# Patient Record
Sex: Female | Born: 1955 | Race: White | Hispanic: No | Marital: Married | Smoking: Never smoker
Health system: Southern US, Community
[De-identification: ages and names within clinical notes are randomized; demographics above are authoritative.]

## PROBLEM LIST (undated history)

## (undated) DIAGNOSIS — G540 Brachial plexus disorders: Secondary | ICD-10-CM

## (undated) DIAGNOSIS — N189 Chronic kidney disease, unspecified: Secondary | ICD-10-CM

## (undated) DIAGNOSIS — K219 Gastro-esophageal reflux disease without esophagitis: Secondary | ICD-10-CM

## (undated) DIAGNOSIS — X58XXXA Exposure to other specified factors, initial encounter: Secondary | ICD-10-CM

## (undated) DIAGNOSIS — D219 Benign neoplasm of connective and other soft tissue, unspecified: Secondary | ICD-10-CM

## (undated) DIAGNOSIS — R112 Nausea with vomiting, unspecified: Secondary | ICD-10-CM

## (undated) DIAGNOSIS — J189 Pneumonia, unspecified organism: Secondary | ICD-10-CM

## (undated) DIAGNOSIS — M722 Plantar fascial fibromatosis: Secondary | ICD-10-CM

## (undated) DIAGNOSIS — K5790 Diverticulosis of intestine, part unspecified, without perforation or abscess without bleeding: Secondary | ICD-10-CM

## (undated) DIAGNOSIS — E042 Nontoxic multinodular goiter: Secondary | ICD-10-CM

## (undated) DIAGNOSIS — Z9889 Other specified postprocedural states: Secondary | ICD-10-CM

## (undated) HISTORY — DX: Benign neoplasm of connective and other soft tissue, unspecified: D21.9

## (undated) HISTORY — DX: Diverticulosis of intestine, part unspecified, without perforation or abscess without bleeding: K57.90

## (undated) HISTORY — PX: OTHER SURGICAL HISTORY: SHX169

## (undated) HISTORY — DX: Brachial plexus disorders: G54.0

## (undated) HISTORY — DX: Plantar fascial fibromatosis: M72.2

## (undated) HISTORY — DX: Nontoxic multinodular goiter: E04.2

## (undated) HISTORY — PX: TONSILLECTOMY: SUR1361

## (undated) HISTORY — DX: Exposure to other specified factors, initial encounter: X58.XXXA

---

## 1993-10-15 DIAGNOSIS — X58XXXA Exposure to other specified factors, initial encounter: Secondary | ICD-10-CM

## 1993-10-15 HISTORY — PX: CHOLECYSTECTOMY: SHX55

## 1993-10-15 HISTORY — PX: SPINAL FUSION: SHX223

## 1993-10-15 HISTORY — DX: Exposure to other specified factors, initial encounter: X58.XXXA

## 1995-10-16 HISTORY — PX: OTHER SURGICAL HISTORY: SHX169

## 1999-06-01 ENCOUNTER — Other Ambulatory Visit: Admission: RE | Admit: 1999-06-01 | Discharge: 1999-06-01 | Payer: Self-pay | Admitting: *Deleted

## 1999-10-16 HISTORY — PX: MYOMECTOMY: SHX85

## 2000-01-24 ENCOUNTER — Ambulatory Visit (HOSPITAL_COMMUNITY): Admission: RE | Admit: 2000-01-24 | Discharge: 2000-01-24 | Payer: Self-pay | Admitting: Obstetrics & Gynecology

## 2000-01-24 ENCOUNTER — Encounter (INDEPENDENT_AMBULATORY_CARE_PROVIDER_SITE_OTHER): Payer: Self-pay

## 2000-05-31 ENCOUNTER — Other Ambulatory Visit: Admission: RE | Admit: 2000-05-31 | Discharge: 2000-05-31 | Payer: Self-pay | Admitting: Obstetrics & Gynecology

## 2001-06-03 ENCOUNTER — Other Ambulatory Visit: Admission: RE | Admit: 2001-06-03 | Discharge: 2001-06-03 | Payer: Self-pay | Admitting: Obstetrics & Gynecology

## 2002-06-03 ENCOUNTER — Other Ambulatory Visit: Admission: RE | Admit: 2002-06-03 | Discharge: 2002-06-03 | Payer: Self-pay | Admitting: Obstetrics & Gynecology

## 2003-06-08 ENCOUNTER — Other Ambulatory Visit: Admission: RE | Admit: 2003-06-08 | Discharge: 2003-06-08 | Payer: Self-pay | Admitting: Obstetrics & Gynecology

## 2005-05-15 ENCOUNTER — Encounter: Admission: RE | Admit: 2005-05-15 | Discharge: 2005-05-15 | Payer: Self-pay | Admitting: Family Medicine

## 2005-05-30 ENCOUNTER — Encounter (INDEPENDENT_AMBULATORY_CARE_PROVIDER_SITE_OTHER): Payer: Self-pay | Admitting: Specialist

## 2005-05-30 ENCOUNTER — Other Ambulatory Visit: Admission: RE | Admit: 2005-05-30 | Discharge: 2005-05-30 | Payer: Self-pay | Admitting: Interventional Radiology

## 2005-05-30 ENCOUNTER — Encounter: Admission: RE | Admit: 2005-05-30 | Discharge: 2005-05-30 | Payer: Self-pay | Admitting: Family Medicine

## 2006-07-04 IMAGING — US US BIOPSY
1 series · 10 of 10 positions shown · non-contrast
Comparison: Thyroid Ultrasound, 05/15/05 ? [REDACTED].

CLINICAL DATA: Dominant nodule within right lobe of thyroid.  Requested to perform fine needle aspiration. 
 ULTRASOUND GUIDED FINE NEEDLE ASPIRATION RIGHT LOBE OF THYROID:

[Series 1: unknown · 0.06mm/px · 10 of 10 slices shown]
[im 1/10]
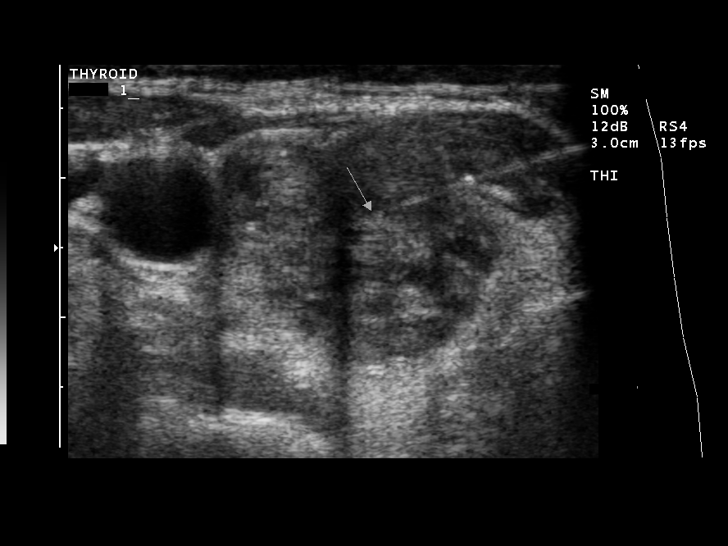
[im 2/10]
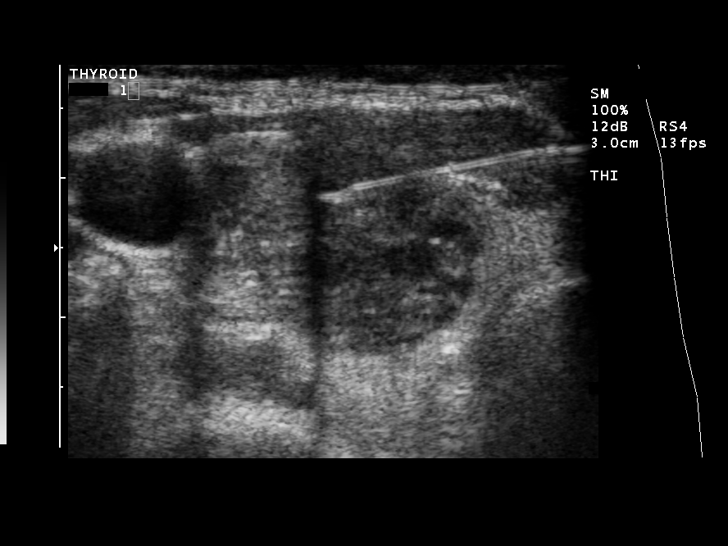
[im 3/10]
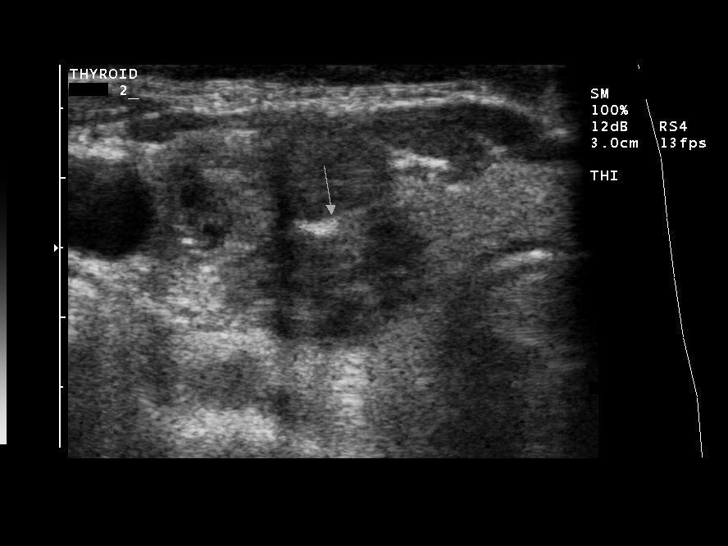
[im 4/10]
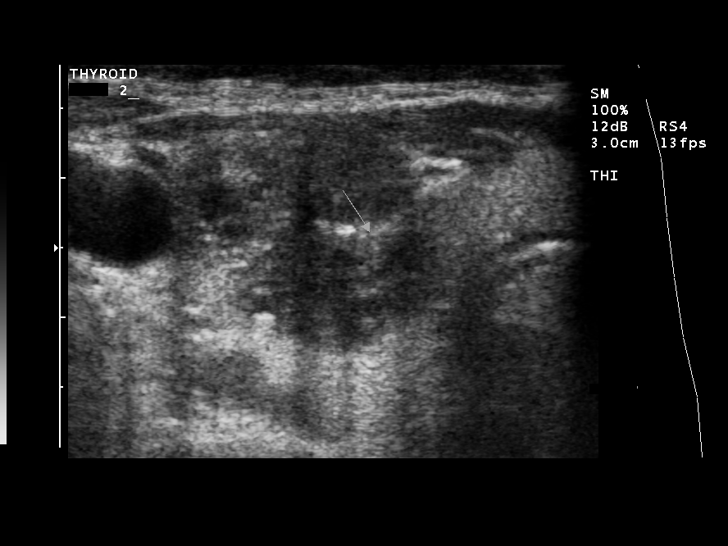
[im 5/10]
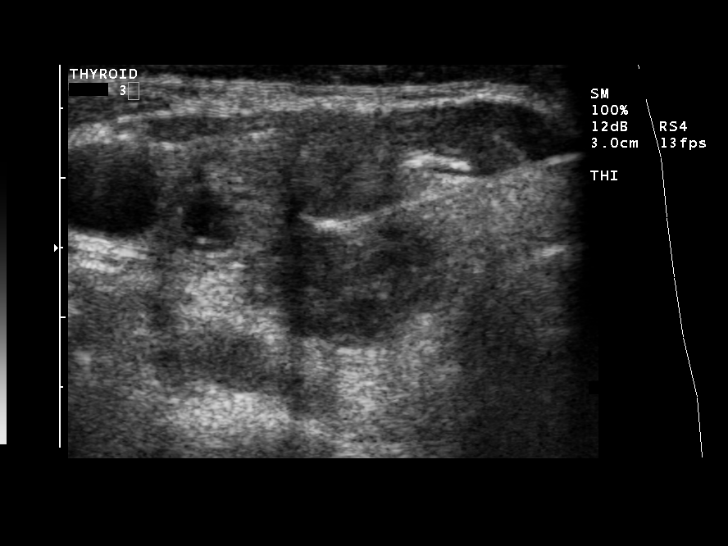
[im 6/10]
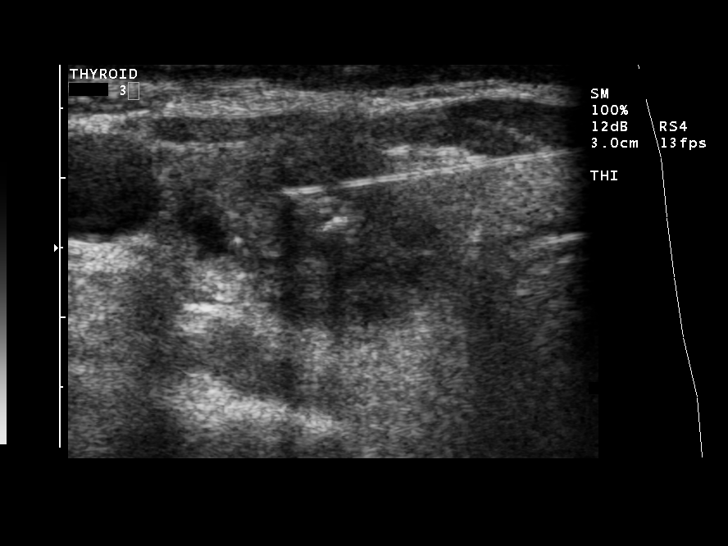
[im 7/10]
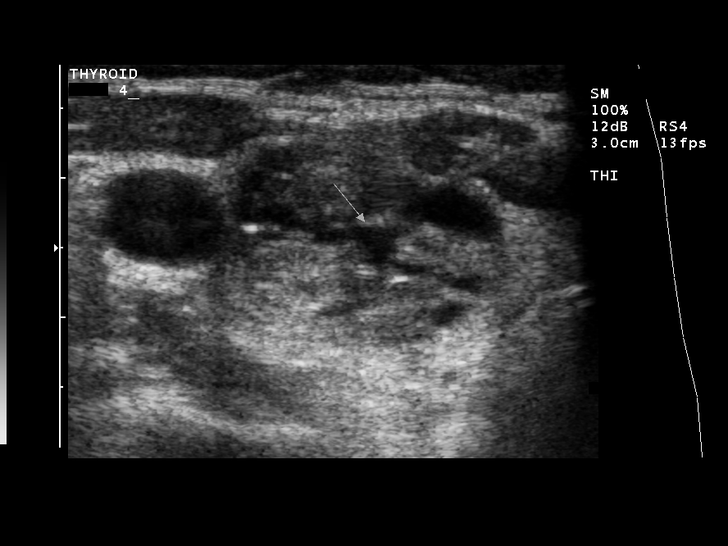
[im 8/10]
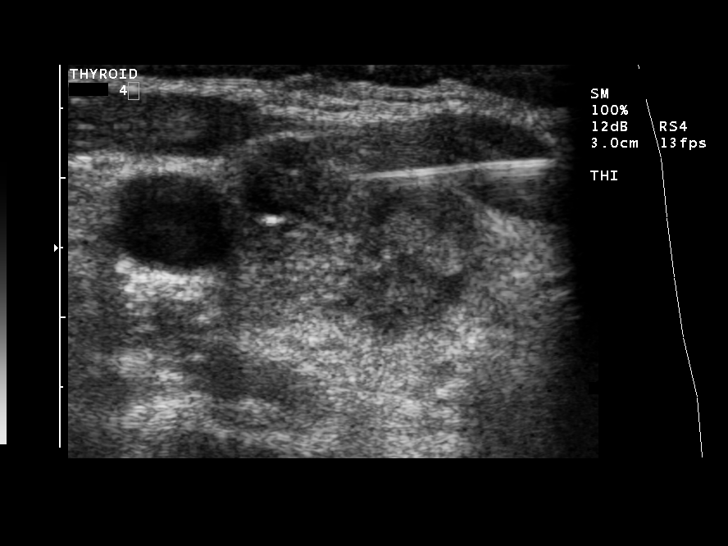
[im 9/10]
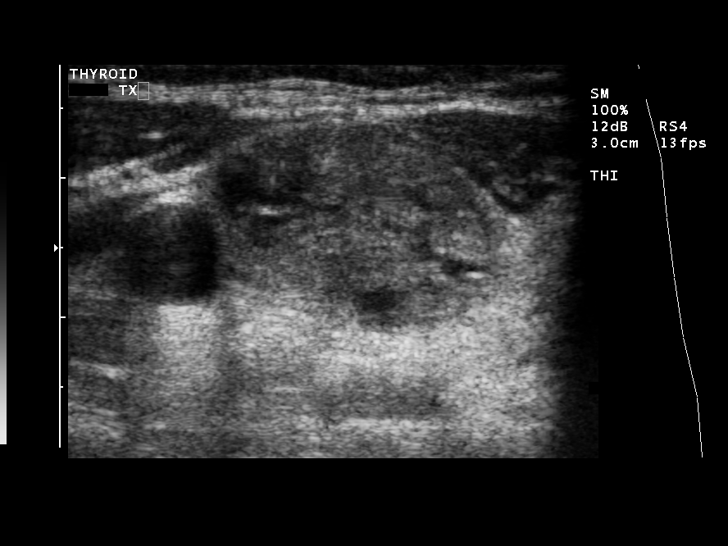
[im 10/10]
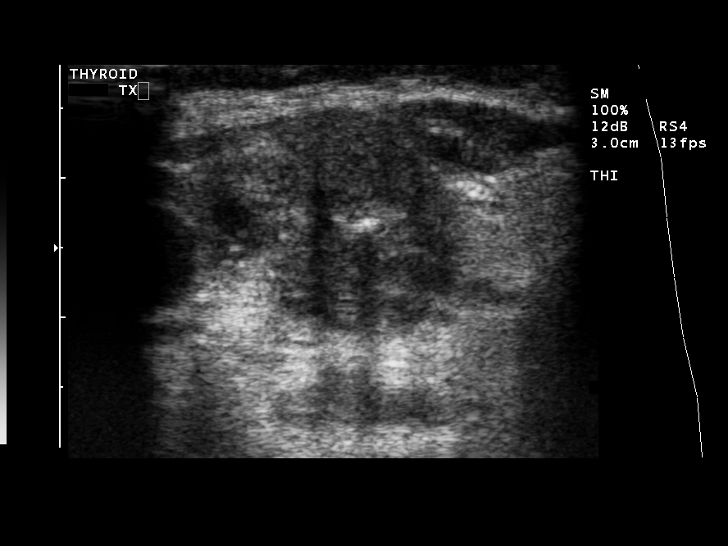

[10 of 10 positions shown; findings below may reference images not displayed]

FINDINGS: The above procedure was thoroughly discussed with the patient and written informed consent was obtained.
 Ultrasound was then performed to localize and mark an adequate site for the biopsy.  The patient was then prepped and draped in a normal sterile fashion, 1% Lidocaine was used for local anesthesia.  Using direct ultrasound guidance, four passes were made using a 25 gauge hypodermic needle into the dominant nodule located within the right lobe of the thyroid.  Ultrasound confirmed placement of the needle on all four occasions.  Approximately 2 cc of somewhat thin, yellowish material was also aspirated. Calcifications within this nodule were noted.  The specimens were given to pathology for further analysis.  Post procedure imaging demonstrated no hematoma or immediate complication. The patient tolerated the procedure well.
IMPRESSION: Successful ultrasound guided fine needle aspiration dominant right lobe of the thyroid.  Final pathology pending.

## 2006-10-15 HISTORY — PX: LAMINECTOMY: SHX219

## 2006-10-15 HISTORY — PX: OTHER SURGICAL HISTORY: SHX169

## 2006-10-17 ENCOUNTER — Ambulatory Visit (HOSPITAL_COMMUNITY): Admission: RE | Admit: 2006-10-17 | Discharge: 2006-10-18 | Payer: Self-pay | Admitting: Orthopedic Surgery

## 2007-08-13 ENCOUNTER — Ambulatory Visit (HOSPITAL_COMMUNITY): Admission: RE | Admit: 2007-08-13 | Discharge: 2007-08-13 | Payer: Self-pay | Admitting: Obstetrics & Gynecology

## 2008-01-21 ENCOUNTER — Encounter: Admission: RE | Admit: 2008-01-21 | Discharge: 2008-01-21 | Payer: Self-pay | Admitting: Family Medicine

## 2009-04-29 ENCOUNTER — Encounter: Admission: RE | Admit: 2009-04-29 | Discharge: 2009-04-29 | Payer: Self-pay | Admitting: Internal Medicine

## 2009-09-27 ENCOUNTER — Ambulatory Visit (HOSPITAL_COMMUNITY): Admission: RE | Admit: 2009-09-27 | Discharge: 2009-09-27 | Payer: Self-pay | Admitting: Obstetrics & Gynecology

## 2009-10-15 HISTORY — PX: ABDOMINAL HYSTERECTOMY: SHX81

## 2009-11-01 ENCOUNTER — Ambulatory Visit (HOSPITAL_COMMUNITY): Admission: RE | Admit: 2009-11-01 | Discharge: 2009-11-01 | Payer: Self-pay | Admitting: Obstetrics & Gynecology

## 2009-11-21 ENCOUNTER — Observation Stay (HOSPITAL_COMMUNITY): Admission: AD | Admit: 2009-11-21 | Discharge: 2009-11-22 | Payer: Self-pay | Admitting: Obstetrics & Gynecology

## 2010-05-23 ENCOUNTER — Encounter: Admission: RE | Admit: 2010-05-23 | Discharge: 2010-05-23 | Payer: Self-pay | Admitting: Internal Medicine

## 2010-10-15 HISTORY — PX: BLADDER SUSPENSION: SHX72

## 2010-12-07 ENCOUNTER — Ambulatory Visit (HOSPITAL_BASED_OUTPATIENT_CLINIC_OR_DEPARTMENT_OTHER)
Admission: RE | Admit: 2010-12-07 | Discharge: 2010-12-07 | Disposition: A | Discharge status: Home / Self Care | Payer: 59 | Attending: Urology | Admitting: Urology

## 2010-12-07 DIAGNOSIS — Z01810 Encounter for preprocedural cardiovascular examination: Secondary | ICD-10-CM | POA: Insufficient documentation

## 2010-12-07 DIAGNOSIS — N393 Stress incontinence (female) (male): Secondary | ICD-10-CM | POA: Insufficient documentation

## 2010-12-07 DIAGNOSIS — Z79899 Other long term (current) drug therapy: Secondary | ICD-10-CM | POA: Insufficient documentation

## 2010-12-07 DIAGNOSIS — Z01812 Encounter for preprocedural laboratory examination: Secondary | ICD-10-CM | POA: Insufficient documentation

## 2010-12-07 LAB — CBC
HCT: 42.4 % (ref 36.0–46.0)
Hemoglobin: 13.9 g/dL (ref 12.0–15.0)
MCH: 30.1 pg (ref 26.0–34.0)
MCHC: 32.8 g/dL (ref 30.0–36.0)
MCV: 91.8 fL (ref 78.0–100.0)

## 2010-12-07 LAB — PROTIME-INR
INR: 0.95 (ref 0.00–1.49)
Prothrombin Time: 12.9 seconds (ref 11.6–15.2)

## 2010-12-07 LAB — TYPE AND SCREEN
ABO/RH(D): O NEG
Antibody Screen: NEGATIVE

## 2010-12-28 NOTE — Op Note (Signed)
  NAMECARAL, Paula Hughes                ACCOUNT NO.:  [id number]  MEDICAL RECORD NO.:  [id number]          PATIENT TYPE:  AMB  LOCATION:  [facility]                         FACILITY:  [facility]  PHYSICIAN:  Martina Sinner, MD DATE OF BIRTH:  Jul 30, 1956  DATE OF PROCEDURE: DATE OF DISCHARGE:                              OPERATIVE REPORT   PREOPERATIVE DIAGNOSIS:  Stress incontinence.  POSTOPERATIVE DIAGNOSIS:  Stress incontinence.  SURGERIES:  Sling [facility]), cystourethropexy plus cystoscopy.  INDICATIONS FOR PROCEDURE:  Ms. Trueba has stress urinary incontinence. She takes Information systems manager.  PROCEDURE IN DETAIL:  She was prepped and draped in the usual fashion. Preoperative laboratory tests were normal.  She was given preoperative antibiotics.  Leg position was excellent at the beginning of the case to minimize the risk of compartment syndrome, neuropathy and DVT.  I made two 1-cm incisions, one fingerbreadth above the symphysis pubis, 1.5 cmlateral to the midline.  I made a 2-cm suburethral incision of appropriate depth after instilling 5 cc of lidocaine epinephrine mixture.  I sharply and bluntly dissected to the urethrovesical angle bilaterally.  With the bladder emptied, I passed a SPARC needle on top of and along the back of the symphysis pubis, staying on the periosteum and also staying lateral and then coming medial to the pulp of my index finger bilaterally.  I cystoscoped the patient.  I had an excellent look in the bladder. There was no injury to the bladder.  There was no movement of the bladder with movement of the trocar.  There was excellent blue jets bilaterally.  The urethra was normal.  With the bladder empty I attached the [facility] sling and brought it up through the retropubic space.  I cut below the blue dots, irrigated the sheath and removed the sheath.  It was tensioned over the fat part of a moderate-sized Kelly clamp.  There was appropriate hypermobility with no spring back  effect.  I was very happy with the position and tension of the sling.  All incisions were irrigated.  I closed the anterior vaginal wall with running 2-0 Vicryl on a CT1 needle.  I did two interrupted sutures.  I cut the sling below the skin and closed the skin in the abdomen with 4-0 Vicryl followed by Dermabond.  The leg position was good.  The catheter was draining blue urine. Hopefully the patient will reach her treatment goal.          ______________________________ Martina Sinner, MD     SAM/MEDQ  D:  12/07/2010  T:  12/07/2010  Job:  [id number]  Electronically Signed by Alfredo Martinez MD on 12/28/2010 12:49:29 PM

## 2010-12-31 LAB — ABO/RH: ABO/RH(D): O NEG

## 2010-12-31 LAB — CBC
MCV: 92.7 fL (ref 78.0–100.0)
RBC: 4.29 MIL/uL (ref 3.87–5.11)
WBC: 5.7 10*3/uL (ref 4.0–10.5)

## 2010-12-31 LAB — COMPREHENSIVE METABOLIC PANEL
ALT: 22 U/L (ref 0–35)
AST: 25 U/L (ref 0–37)
Albumin: 4.1 g/dL (ref 3.5–5.2)
Alkaline Phosphatase: 59 U/L (ref 39–117)
Potassium: 4.6 mEq/L (ref 3.5–5.1)
Sodium: 141 mEq/L (ref 135–145)
Total Protein: 6.8 g/dL (ref 6.0–8.3)

## 2010-12-31 LAB — TYPE AND SCREEN: Antibody Screen: NEGATIVE

## 2010-12-31 LAB — PREGNANCY, URINE: Preg Test, Ur: NEGATIVE

## 2011-01-03 LAB — CBC
Platelets: 218 10*3/uL (ref 150–400)
RBC: 3.82 MIL/uL — ABNORMAL LOW (ref 3.87–5.11)
WBC: 5.5 10*3/uL (ref 4.0–10.5)

## 2011-03-02 NOTE — Op Note (Signed)
NAMEFLORENE, BRILL                ACCOUNT NO.:  [id number]   MEDICAL RECORD NO.:  [id number]          PATIENT TYPE:  AMB   LOCATION:  DAY                          FACILITY:  [facility]   PHYSICIAN:  Ronald A. Gioffre, M.D.DATE OF BIRTH:  11/15/1955   DATE OF PROCEDURE:  10/17/2006  DATE OF DISCHARGE:                               OPERATIVE REPORT   SURGEONS:  Windy Fast A. Darrelyn Hillock, M.D.   ASSISTANT:  Marlowe Kays, M.D.   PREOPERATIVE DIAGNOSES:  1. Severe spinal stenosis at L4-5.  2. 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  3. Spinal recess stenosis at L5-S1 on the left.  4. Herniated disk at L5-S1 central to left.   POSTOPERATIVE DIAGNOSES:  1. Severe spinal stenosis at L4-5.  2. 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  3. Spinal recess stenosis at L5-S1 on the left.  4. Herniated disk at L5-S1 central to left.   OPERATION:  1. Central decompressive lumbar laminectomy at L4-5.  2. Facetectomy at L4-5 on the left.  3. Excision of a large synovial cyst and scar tissue at L4-5      compressing the L5 nerve root.  4. Foraminotomy.  5. Decompression of the lateral recess by carrying out hemilaminectomy      at L5-S1 on the left.  6. Microdiskectomy L5-S1 on the left.   PROCEDURE IN DETAIL:  Under general anesthesia,  a routine orthopedic  prepping and draping of the lower back was carried out.  The patient was  given 1 gram of IV Ancef.  With the patient on the spinal frame, two  needles were placed in the back for localization purposes.  An x-ray was  taken.  At this time an incision was made over the L4-5 and L5-S1  interspace.  Self-retaining retractors were inserted after I separated  the muscle from the lamina bilaterally.  We then went down and took  another x-ray and verified the exact position.  We removed the spinous  process of L4 and partial of L5.  I then went  down and did a complete  decompress central decompressive lumbar laminectomy at L4-5 and I did a  facetectomy on the left because a large synovial cyst and the facette  overgrowth.  We decompressed lateral recess nicely and decompressed the  L5 nerve root.  We did a nice foraminotomy as well.   There was no diskectomy necessary on that side.  I went up and did a  little decompression of the root above as well.  We went easily now and  passed a hockey-stick above and below both roots on the left side.  Following that we loosely applied some thrombin-soaked Gelfoam, went  down and did a hemilaminectomy L5-S1 on the left.  She had severe  stenosis of the lateral recess on that side.  We decompressed the  lateral recess as well, cauterized the lateral recess veins with a  bipolar.  We identified the S1 nerve root, did a nice foraminotomy and  gently retracted it medially and went down and identified the disk and  then made a cruciate incision at the posterior longitudinal ligament and  did a microdiskectomy at L5-S1 on the left.   We thoroughly irrigated out the area.  We now had good release of the  pressure from the roots.  As I mentioned 4-5 went bilaterally and  cleaned out both recesses on both sides even though she was symptomatic  only on the left.  Thoroughly irrigated out the area, loosely  approximated the wound in the usual fashion.  I left the distal and  proximal deep parts of the wound partially open for drainage purposes  and closed the remaining part of the wound in the usual fashion.  Metal  staples were used to close the skin.  Sterile Neosporin dressing was  applied.  The patient left the operating room in satisfactory condition.           ______________________________  Georges Lynch Darrelyn Hillock, M.D.     RAG/MEDQ  D:  10/17/2006  T:  10/17/2006  Job:  [id number]

## 2011-03-02 NOTE — Op Note (Signed)
[facility]  Patient:    Paula Hughes, Paula Hughes                         MRN: [id number] Adm. Date:  [id number] Attending:  Genia Del                           Operative Report  DATE OF BIRTH:                03-02-1956  PREOPERATIVE DIAGNOSIS:       Menometrorrhagia, probable endometrial polyps.  POSTOPERATIVE DIAGNOSIS:      Menometrorrhagia, submucosal myoma.  OPERATION:                    Hysteroscopy with dilatation and curettage and                               myomectomy. SURGEON:                      Genia Del, M.D.  ASSISTANT:  ANESTHESIA:  ANESTHESIOLOGIST:             Raul Del, M.D.  ESTIMATED BLOOD LOSS:  INDICATIONS:  DESCRIPTION OF PROCEDURE:     Under MAC with paracervical block, the patient being in lithotomy position, we proceed to Betadine prep on the suprapubic, vulva and  vaginal areas.   The bladder catheter is used to empty the bladder and the patient is prepped as usual.  The vaginal exam reveals an anteverted uterus, normal volume, mobile.  No adnexal mass palpated.  The speculum is introduced.  The paracervical block is done with 20 cc of lidocaine 1% at 4:00 and 8:00.  The anterior lip of the cervix is grasped with a tenaculum and dilatation is done until Hegar dilator #31. This is done easily.  A diagnostic hysteroscopy is then performed revealing a submucosal myoma on the right posterior aspect of the uterine cavity measuring about 2 cm in diameter.  No polyps are seen.  We then used the operative hysteroscope with the double loop to remove the myoma.  Myomectomy is done progressively with the double loop. Hemostasis is then controlled with cauterization. Hemostasis is adequate.  The cavity is otherwise normal.  We remove the instruments.  The estimated blood loss was 50 cc.  Deficits was 350 cc. No complications occurred and the patient was brought to the recovery room in  good  status.DD:  01/24/00 TD:  01/25/00 Job: [id number]

## 2011-06-14 ENCOUNTER — Ambulatory Visit: Payer: 59 | Admitting: Family Medicine

## 2011-07-03 ENCOUNTER — Ambulatory Visit (INDEPENDENT_AMBULATORY_CARE_PROVIDER_SITE_OTHER): Payer: 59 | Admitting: Sports Medicine

## 2011-07-03 DIAGNOSIS — M25569 Pain in unspecified knee: Secondary | ICD-10-CM

## 2011-07-03 DIAGNOSIS — M2170 Unequal limb length (acquired), unspecified site: Secondary | ICD-10-CM

## 2011-07-03 DIAGNOSIS — M25561 Pain in right knee: Secondary | ICD-10-CM

## 2011-07-03 DIAGNOSIS — M7740 Metatarsalgia, unspecified foot: Secondary | ICD-10-CM | POA: Insufficient documentation

## 2011-07-03 DIAGNOSIS — M25562 Pain in left knee: Secondary | ICD-10-CM | POA: Insufficient documentation

## 2011-07-03 DIAGNOSIS — M7750 Other enthesopathy of unspecified foot: Secondary | ICD-10-CM

## 2011-07-03 NOTE — Assessment & Plan Note (Addendum)
Given a strap - patellar helix to try  Has good strength except hip felxion Given rehab exercises

## 2011-07-03 NOTE — Progress Notes (Signed)
  Subjective:    Patient ID: Paula Hughes, female    DOB: January 30, 1956, 55 y.o.   MRN: [id number]  HPI Pt started Zumba x 1 year ago and her knees ache afterwards. This "aching" is 4-5/10 and can last from half day to whole day. Icing and Advil are helpful. Pain is bad enough to cause her to limp.  Also, pt had a fall onto her R flexed knee and her L hip in Aug 2012. She had lots of pain initially but it is improving. It continues to be painful with direct weight on knee when she is kneeling or going up a lot of stairs. She also describes transient neuropathic pain that extends from lat patella to lat condyle. No pain with walking.   Review of Systems     Objective:   Physical Exam NAD Knees: bilateral crepitus with movement, nl ROM (0 deg extension and 160 deg flexion), 5/5 knee flexion and ext bilaterally R knee: point tenderness localized to a spot on patella, point tenderness along joint line below lat femoral condyle; Neg McMurray's, good ACL/MCL/LCL stability on R knee L knee: slight valgus deformity R hip flexion: weaker at 4/5 strength  Bilat pronation of long arch with midfoot shift Transverse arch collapse  Leg length discrepancy- rt leg is just slightly shorter than left Breakdown of rt longitudinal arch, breakdown of transverse arch bilaterally     Assessment & Plan:  Pt's bilateral knee pain after Zumba is most likely secondary to leg length discrepancy, inc stress on knees from activity and shoes without much support.  Pt's lat neuropathic pain in Rt knee likely from Anterior Geniculate Nerve irritation. Will try knee strap.  Weak hip flexion: pt given strengthening exercises to do- hip rotation and step up  Corrections: Zumba shoes- bilateral small scaphoid pads and heel lift correction on right Walking shoes- green insoles with metatarsal pads, pt to wear home heel cup on the right  Return in 1 month

## 2011-07-03 NOTE — Assessment & Plan Note (Signed)
MT pads added to sports insoles for exercise  Scaphoid pads added to Zumba shoes

## 2011-07-03 NOTE — Assessment & Plan Note (Signed)
Correction added on left  This improves her gait to lessen the genu valgus

## 2011-07-11 ENCOUNTER — Other Ambulatory Visit (HOSPITAL_COMMUNITY): Payer: Self-pay | Admitting: Obstetrics & Gynecology

## 2011-07-11 DIAGNOSIS — Z1231 Encounter for screening mammogram for malignant neoplasm of breast: Secondary | ICD-10-CM

## 2011-07-19 ENCOUNTER — Ambulatory Visit (HOSPITAL_COMMUNITY)
Admission: RE | Admit: 2011-07-19 | Discharge: 2011-07-19 | Disposition: A | Discharge status: Home / Self Care | Payer: 59 | Source: Ambulatory Visit | Attending: Obstetrics & Gynecology | Admitting: Obstetrics & Gynecology

## 2011-07-19 DIAGNOSIS — Z1231 Encounter for screening mammogram for malignant neoplasm of breast: Secondary | ICD-10-CM | POA: Insufficient documentation

## 2011-07-24 ENCOUNTER — Other Ambulatory Visit: Payer: Self-pay | Admitting: Obstetrics & Gynecology

## 2011-07-24 DIAGNOSIS — R928 Other abnormal and inconclusive findings on diagnostic imaging of breast: Secondary | ICD-10-CM

## 2011-07-26 ENCOUNTER — Ambulatory Visit (INDEPENDENT_AMBULATORY_CARE_PROVIDER_SITE_OTHER): Payer: 59 | Admitting: Family Medicine

## 2011-07-26 ENCOUNTER — Encounter: Payer: Self-pay | Admitting: *Deleted

## 2011-07-26 ENCOUNTER — Encounter: Payer: Self-pay | Admitting: Family Medicine

## 2011-07-26 DIAGNOSIS — Z79899 Other long term (current) drug therapy: Secondary | ICD-10-CM

## 2011-07-26 DIAGNOSIS — E559 Vitamin D deficiency, unspecified: Secondary | ICD-10-CM

## 2011-07-26 DIAGNOSIS — E042 Nontoxic multinodular goiter: Secondary | ICD-10-CM | POA: Insufficient documentation

## 2011-07-26 DIAGNOSIS — Z0000 Encounter for general adult medical examination without abnormal findings: Secondary | ICD-10-CM

## 2011-07-26 DIAGNOSIS — Z23 Encounter for immunization: Secondary | ICD-10-CM

## 2011-07-26 LAB — POCT URINALYSIS DIPSTICK
Blood, UA: NEGATIVE
Glucose, UA: NEGATIVE
Ketones, UA: NEGATIVE
Protein, UA: NEGATIVE
Spec Grav, UA: 1.01

## 2011-07-26 NOTE — Patient Instructions (Addendum)
HEALTH MAINTENANCE RECOMMENDATIONS:  It is recommended that you get at least 30 minutes of aerobic exercise at least 5 days/week (for weight loss, you may need as much as 60-90 minutes). This can be any activity that gets your heart rate up. This can be divided in 10-15 minute intervals if needed, but try and build up your endurance at least once a week.  Weight bearing exercise is also recommended twice weekly.  Eat a healthy diet with lots of vegetables, fruits and fiber.  "Colorful" foods have a lot of vitamins (ie green vegetables, tomatoes, red peppers, etc).  Limit sweet tea, regular sodas and alcoholic beverages, all of which has a lot of calories and sugar.  Up to 1 alcoholic drink daily may be beneficial for women (unless trying to lose weight, watch sugars).  Drink a lot of water.  Calcium recommendations are 1200-1500 mg daily (1500 mg for postmenopausal women or women without ovaries), and vitamin D 1000 IU daily.  This should be obtained from diet and/or supplements (vitamins), and calcium should not be taken all at once, but in divided doses.  Monthly self breast exams and yearly mammograms for women over the age of 47 is recommended.  Sunscreen of at least SPF 30 should be used on all sun-exposed parts of the skin when outside between the hours of 10 am and 4 pm (not just when at beach or pool, but even with exercise, golf, tennis, and yard work!)  Use a sunscreen that says "broad spectrum" so it covers both UVA and UVB rays, and make sure to reapply every 1-2 hours.  Remember to change the batteries in your smoke detectors when changing your clock times in the spring and fall.  Use your seat belt every time you are in a car, and please drive safely and not be distracted with cell phones and texting while driving.   Check insurance regarding zostavax--may need to wait until age 56  Consider tapering down to 1/2 tablet of your estradiol in the cooler weather if doing very well  without hot flashes
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
## 2011-07-31 ENCOUNTER — Other Ambulatory Visit: Payer: Self-pay | Admitting: Family Medicine

## 2011-07-31 ENCOUNTER — Ambulatory Visit (INDEPENDENT_AMBULATORY_CARE_PROVIDER_SITE_OTHER): Payer: 59 | Admitting: Sports Medicine

## 2011-07-31 ENCOUNTER — Other Ambulatory Visit: Payer: 59

## 2011-07-31 ENCOUNTER — Telehealth: Payer: Self-pay | Admitting: Family Medicine

## 2011-07-31 VITALS — BP 108/68

## 2011-07-31 DIAGNOSIS — M25561 Pain in right knee: Secondary | ICD-10-CM

## 2011-07-31 DIAGNOSIS — E042 Nontoxic multinodular goiter: Secondary | ICD-10-CM

## 2011-07-31 DIAGNOSIS — Z79899 Other long term (current) drug therapy: Secondary | ICD-10-CM

## 2011-07-31 DIAGNOSIS — M7750 Other enthesopathy of unspecified foot: Secondary | ICD-10-CM

## 2011-07-31 DIAGNOSIS — M722 Plantar fascial fibromatosis: Secondary | ICD-10-CM | POA: Insufficient documentation

## 2011-07-31 DIAGNOSIS — E559 Vitamin D deficiency, unspecified: Secondary | ICD-10-CM

## 2011-07-31 DIAGNOSIS — M25569 Pain in unspecified knee: Secondary | ICD-10-CM

## 2011-07-31 DIAGNOSIS — M2170 Unequal limb length (acquired), unspecified site: Secondary | ICD-10-CM

## 2011-07-31 DIAGNOSIS — M7740 Metatarsalgia, unspecified foot: Secondary | ICD-10-CM

## 2011-07-31 LAB — COMPREHENSIVE METABOLIC PANEL
ALT: 23 U/L (ref 0–35)
Albumin: 4.2 g/dL (ref 3.5–5.2)
BUN: 20 mg/dL (ref 6–23)
CO2: 24 mEq/L (ref 19–32)
Calcium: 9.9 mg/dL (ref 8.4–10.5)
Chloride: 107 mEq/L (ref 96–112)
Creat: 0.77 mg/dL (ref 0.50–1.10)
Potassium: 4.5 mEq/L (ref 3.5–5.3)

## 2011-07-31 LAB — LIPID PANEL
LDL Cholesterol: 83 mg/dL (ref 0–99)
Triglycerides: 54 mg/dL (ref <150)

## 2011-07-31 NOTE — Progress Notes (Signed)
  Subjective:    Patient ID: Paula Hughes, female    DOB: 06/15/1956, 55 y.o.   MRN: [id number]  HPI  Pt presents to clinic for f/u of bilat knee pain which is improving, and metatarsalgia. Knees still sore after zumba.  Has tried full knee sleeves and bodyhelix patellar straps for zumba.  Patellar straps seem to be more helpful.   Using sports insoles in walking shoes with MT pads which are comfortable, except she feels lt MT pad is too far forward.  In zumba shoes has scaphoid pad and heel lift on rt, which is also comfortable.  Has heel pain on rt which she has been using a gel heel cushion for which helps significantly.   She feels that her plantar fascia symptoms have increased as she has been more active with exercise Morning pain is present on the right    Review of Systems     Objective:   Physical Exam NAD  Hip abduction mildly weak bilaterally Hip rotation strong bilat She has crepitation on flexion and extension of the right knee Moderate crepitation is noted on flexion extension of the left knee There is tenderness on palpation of the right heel at the medial insertion of the plantar fascia She has good range of motion of the great toe She has mild loss of the longitudinal and transverse arch She does not walk with a limp        Assessment & Plan:

## 2011-07-31 NOTE — Assessment & Plan Note (Signed)
Continue metatarsal pads  We repositioned the left one slightly more forward

## 2011-07-31 NOTE — Assessment & Plan Note (Signed)
A correction with felt lift is made into her dance shoes and on to her sports insoles

## 2011-07-31 NOTE — Telephone Encounter (Signed)
Gave to jo 

## 2011-07-31 NOTE — Telephone Encounter (Signed)
We had discussed at visit that her lipids was excellent in 2010, and only needed to be checked every 5 years, which is why we didn't order lipid panel.  If she has changed her mind, and would like lipids checked (which it sounds like she does), then please add FLP with a dx code of v77.91.  Please let [name] know.  Thanks

## 2011-07-31 NOTE — Patient Instructions (Signed)
Please try new corrections in your shoes  Start stretches for plantar fasciitis, and continue heels lifts with knees straight and knees bent  Soak right foot in ice water at the end of the day   Do not get too much bend in your knee (not past 45 degrees)  when using weight equipment at the gym  Please follow up in 8 weeks if you are not improving

## 2011-07-31 NOTE — Assessment & Plan Note (Signed)
She will continue to use a gel heel cup  Stretches ice and exercises  We added a scaphoid pad to her sports insoles  Recheck in 8 weeks and if not improved consider ultrasound scan

## 2011-07-31 NOTE — Assessment & Plan Note (Signed)
She is given another patella helix strap as these seem to lessen her knee problems with exercise class  She is to limit the degree of knee bend for her exercise

## 2011-08-01 ENCOUNTER — Encounter: Payer: Self-pay | Admitting: Family Medicine

## 2011-08-01 LAB — VITAMIN D 25 HYDROXY (VIT D DEFICIENCY, FRACTURES): Vit D, 25-Hydroxy: 46 ng/mL (ref 30–89)

## 2011-08-21 ENCOUNTER — Ambulatory Visit
Admission: RE | Admit: 2011-08-21 | Discharge: 2011-08-21 | Disposition: A | Discharge status: Home / Self Care | Payer: 59 | Source: Ambulatory Visit | Attending: Obstetrics & Gynecology | Admitting: Obstetrics & Gynecology

## 2011-08-21 DIAGNOSIS — R928 Other abnormal and inconclusive findings on diagnostic imaging of breast: Secondary | ICD-10-CM

## 2011-09-19 ENCOUNTER — Ambulatory Visit (INDEPENDENT_AMBULATORY_CARE_PROVIDER_SITE_OTHER): Payer: 59 | Admitting: Sports Medicine

## 2011-09-19 VITALS — BP 130/80

## 2011-09-19 DIAGNOSIS — M7740 Metatarsalgia, unspecified foot: Secondary | ICD-10-CM

## 2011-09-19 DIAGNOSIS — M25561 Pain in right knee: Secondary | ICD-10-CM

## 2011-09-19 DIAGNOSIS — M7750 Other enthesopathy of unspecified foot: Secondary | ICD-10-CM

## 2011-09-19 DIAGNOSIS — M722 Plantar fascial fibromatosis: Secondary | ICD-10-CM

## 2011-09-19 DIAGNOSIS — M25569 Pain in unspecified knee: Secondary | ICD-10-CM

## 2011-09-19 NOTE — Assessment & Plan Note (Addendum)
Medium MT pads added to shoes for work and zumba May later add to all shoes Consider making custom orthotics if better control needed Currently getting good results with sports insoles

## 2011-09-19 NOTE — Progress Notes (Signed)
  Subjective:    Patient ID: Paula Hughes, female    DOB: 08/10/1956, 55 y.o.   MRN: [id number]  HPI  Knee pain has improved, uses patellar straps which are very helpful.  Has plantar fasciitis on the right foot. Pain at insertion of PF at heel.   Using heel cup, ice baths at night, home exercises. Does stretches in the morning before getting up. Needs note to be able to wear athletic shoes at work.   Has more severe forefoot pain bilat but more on left Hard calluses over forefoot  Fam HX : mother had RA/  Father had peripheral neuropathy and non hodgkins lymphoma       Review of Systems     Objective:   Physical Exam   Rt foot exam: Good great toe motion Tenderness with palpation at insertion of PF on rt heel Sitting has cavus foot Standing arch drops 1-2 cm splaying of 1-2 toes Hammering of PIP 2-3 toes  Morton's callus on rt dropping of MT heads 2-4  Lt foot has thick morton's callus Small cystic change anterior to 2nd MTP Slight hammering of PIP 2-3       Assessment & Plan:

## 2011-09-19 NOTE — Patient Instructions (Signed)
Please continue doing icing, and plantar fasciitis exercises and stretches  Try arch strap on right foot  Use shoes with supportive insoles  We would like to recheck this after 6 wks to 8 wks of trying this  Bring additional shoes for MT pads

## 2011-09-19 NOTE — Assessment & Plan Note (Signed)
We will try an arch strap Continue exercises, stretching, and icing We will ask her to wear supportive shoes in the work environment until this improves

## 2011-09-19 NOTE — Assessment & Plan Note (Signed)
This is clearly improved and able to cont exercise

## 2011-11-07 ENCOUNTER — Ambulatory Visit (INDEPENDENT_AMBULATORY_CARE_PROVIDER_SITE_OTHER): Payer: 59 | Admitting: Sports Medicine

## 2011-11-07 DIAGNOSIS — M722 Plantar fascial fibromatosis: Secondary | ICD-10-CM

## 2011-11-07 DIAGNOSIS — M7750 Other enthesopathy of unspecified foot: Secondary | ICD-10-CM

## 2011-11-07 DIAGNOSIS — M7740 Metatarsalgia, unspecified foot: Secondary | ICD-10-CM

## 2011-11-07 NOTE — Progress Notes (Signed)
  Subjective:    Patient ID: Paula Hughes, female    DOB: December 19, 1955, 56 y.o.   MRN: [id number]  HPI The patient returns for followup of her metatarsalgia as well as plantar fasciitis on the right side. She's wearing several heel pad inserts in her right shoe, and metatarsal pads in both shoes. She's about 20% better with regards to her plantar fasciitis, and is really looking just to get some more metatarsal pads.   Review of Systems    No fevers, chills, night sweats, weight loss, chest pain, or shortness of breath.  Objective:   Physical Exam  Cavus foot, Morton's callus. Still tender to palpation at right plantar fascia.       Assessment & Plan:   Metatarsalgia: 6 metatarsal pads given. Patient will follow up as needed.  Plantar fasciitis: She will continue to do the rehabilitation exercises. Followup as needed.

## 2012-02-21 ENCOUNTER — Telehealth: Payer: Self-pay | Admitting: Family Medicine

## 2012-02-21 MED ORDER — NITROFURANTOIN MONOHYD MACRO 100 MG PO CAPS: 100.0000 mg | ORAL_CAPSULE | Freq: Two times a day (BID) | ORAL | Status: DC

## 2012-02-21 NOTE — Telephone Encounter (Signed)
Spoke with patient and she states that her symptoms include burning after urination and frequency. She reports no fever, no vomiting, no flank pain and no back pain. Called in Macrobid 100mg  #14 BID x 7 days to [facility]. She will follow up if still having symptoms after completion of abx.

## 2012-02-21 NOTE — Telephone Encounter (Signed)
Please document her symptoms, and ensure no fevers or flank pain, or vomiting.  If simple UTI symptoms without these other symptoms, okay for macrobid 100mg  po BID x 7 days.  Check that pt has done well with this in past (no recent UTI's).

## 2012-02-21 NOTE — Telephone Encounter (Signed)
Pt called and stated she has a uti. Pt also states that she has a long history of these that is well documented. Pt is requesting that you call her in something. She states due to her work schedule it is impossible for her to come in and because of history she felt that you might call in an rx. Pt uses [facility].

## 2012-04-29 ENCOUNTER — Telehealth: Payer: Self-pay | Admitting: Internal Medicine

## 2012-04-29 NOTE — Telephone Encounter (Signed)
Doesn't look like I rx'd this per computer. She sees a GYN, so should be sent to GYN for renewal

## 2012-04-30 NOTE — Telephone Encounter (Signed)
Faxed back to pharmacy asking them to fax to pt's gyn.

## 2012-08-07 ENCOUNTER — Ambulatory Visit (INDEPENDENT_AMBULATORY_CARE_PROVIDER_SITE_OTHER): Payer: 59 | Admitting: Family Medicine

## 2012-08-07 ENCOUNTER — Encounter: Payer: Self-pay | Admitting: Family Medicine

## 2012-08-07 VITALS — BP 122/74 | HR 60 | Ht 70.0 in | Wt 185.0 lb

## 2012-08-07 DIAGNOSIS — Z0000 Encounter for general adult medical examination without abnormal findings: Secondary | ICD-10-CM

## 2012-08-07 DIAGNOSIS — E042 Nontoxic multinodular goiter: Secondary | ICD-10-CM

## 2012-08-07 DIAGNOSIS — R079 Chest pain, unspecified: Secondary | ICD-10-CM

## 2012-08-07 LAB — CBC WITH DIFFERENTIAL/PLATELET
Eosinophils Absolute: 0.1 10*3/uL (ref 0.0–0.7)
Eosinophils Relative: 3 % (ref 0–5)
HCT: 42.7 % (ref 36.0–46.0)
Lymphs Abs: 1.1 10*3/uL (ref 0.7–4.0)
MCH: 30.7 pg (ref 26.0–34.0)
MCV: 93.6 fL (ref 78.0–100.0)
Monocytes Absolute: 0.3 10*3/uL (ref 0.1–1.0)
Platelets: 217 10*3/uL (ref 150–400)
RBC: 4.56 MIL/uL (ref 3.87–5.11)
RDW: 12.7 % (ref 11.5–15.5)

## 2012-08-07 LAB — COMPREHENSIVE METABOLIC PANEL
ALT: 15 U/L (ref 0–35)
Alkaline Phosphatase: 55 U/L (ref 39–117)
Sodium: 140 mEq/L (ref 135–145)
Total Bilirubin: 0.8 mg/dL (ref 0.3–1.2)
Total Protein: 6.5 g/dL (ref 6.0–8.3)

## 2012-08-07 LAB — POCT URINALYSIS DIPSTICK
Leukocytes, UA: NEGATIVE
Protein, UA: NEGATIVE
Urobilinogen, UA: NEGATIVE

## 2012-08-07 NOTE — Patient Instructions (Addendum)
HEALTH MAINTENANCE RECOMMENDATIONS:  It is recommended that you get at least 30 minutes of aerobic exercise at least 5 days/week (for weight loss, you may need as much as 60-90 minutes). This can be any activity that gets your heart rate up. This can be divided in 10-15 minute intervals if needed, but try and build up your endurance at least once a week.  Weight bearing exercise is also recommended twice weekly.  Eat a healthy diet with lots of vegetables, fruits and fiber.  "Colorful" foods have a lot of vitamins (ie green vegetables, tomatoes, red peppers, etc).  Limit sweet tea, regular sodas and alcoholic beverages, all of which has a lot of calories and sugar.  Up to 1 alcoholic drink daily may be beneficial for women (unless trying to lose weight, watch sugars).  Drink a lot of water.  Calcium recommendations are 1200-1500 mg daily (1500 mg for postmenopausal women or women without ovaries), and vitamin D 1000 IU daily.  This should be obtained from diet and/or supplements (vitamins), and calcium should not be taken all at once, but in divided doses.  Monthly self breast exams and yearly mammograms for women over the age of 20 is recommended.  Sunscreen of at least SPF 30 should be used on all sun-exposed parts of the skin when outside between the hours of 10 am and 4 pm (not just when at beach or pool, but even with exercise, golf, tennis, and yard work!)  Use a sunscreen that says "broad spectrum" so it covers both UVA and UVB rays, and make sure to reapply every 1-2 hours.  Remember to change the batteries in your smoke detectors when changing your clock times in the spring and fall.  Use your seat belt every time you are in a car, and please drive safely and not be distracted with cell phones and texting while driving.  See GYN yearly for breast and pelvic exam (or return here for them).  Chest and throat symptoms--not truly typical of angina or reflux.  Possibly esophageal spasm.  Given  infrequent episodes, and short-lived nature, will just keep track for now.  If is exertional, nor not self-resolving, go to ER.  Can try acid-reducing medication at time of symptoms to see if it helps (mylanta, maalox, pepcid complete).  Keep journal with symptoms, severity, what you were doing, what you had eaten, etc. And return to discuss if having ongoing problems.  May need further evaluation (?cardiac vs GI).  Check with your insurance regarding shingles vaccine (zostavax) coverage.  They may not cover until age 88, but if they do, you can return for nurse visit.
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
## 2012-08-08 ENCOUNTER — Encounter: Payer: Self-pay | Admitting: Family Medicine

## 2012-08-08 NOTE — Addendum Note (Signed)
Addended by: Debbrah Alar F on: 08/08/2012 11:02 AM   Modules accepted: Orders

## 2012-10-20 ENCOUNTER — Encounter: Payer: 59 | Admitting: Medical

## 2012-11-19 ENCOUNTER — Telehealth: Payer: Self-pay | Admitting: *Deleted

## 2012-11-19 NOTE — Telephone Encounter (Signed)
Called patient and gave her Dr.Knapp's recommendation, message left asking her to call back to schedule OV.

## 2012-11-19 NOTE — Telephone Encounter (Signed)
She is most likely referring to spironolactone, which can be used for treatment of acne (especially in people with PCOS).  This would require an OV go discuss medication, risks, side effects.

## 2012-11-19 NOTE — Telephone Encounter (Signed)
Patient called and wanted to know if you would rx water pills for her. She states that her co-worker sees a Armed forces operational officer and he rx's these to eliminate zits on her face(deep ones). Her co-workers face has completely cleared up and looks beautiful. Would you be willing to give this a try for a little while and see if it works? Please advise.

## 2012-11-20 ENCOUNTER — Encounter: Payer: Self-pay | Admitting: Family Medicine

## 2012-11-20 ENCOUNTER — Ambulatory Visit (INDEPENDENT_AMBULATORY_CARE_PROVIDER_SITE_OTHER): Payer: 59 | Admitting: Family Medicine

## 2012-11-20 VITALS — BP 102/62 | HR 60 | Ht 70.0 in | Wt 180.0 lb

## 2012-11-20 DIAGNOSIS — L708 Other acne: Secondary | ICD-10-CM

## 2012-11-20 DIAGNOSIS — L700 Acne vulgaris: Secondary | ICD-10-CM

## 2012-11-20 MED ORDER — ERYTHROMYCIN 2 % EX GEL: Freq: Two times a day (BID) | CUTANEOUS | Status: DC
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
## 2012-11-20 NOTE — Patient Instructions (Addendum)
Use the erythromycin twice daily. Also try an OTC Benzoyl peroxide agent (1-2x daily, back off if too drying)   Look into ProActive  Consider the spironolactone if not tolerating other topical treatment.  Return in 3-6 months.  Acne Acne is a skin problem that causes pimples. Acne occurs when the pores in your skin get blocked. Your pores may become red, sore, and swollen (inflamed), or infected with a common skin bacterium (Propionibacterium acnes). Acne is a common skin problem. Up to 80% of people get acne at some time. Acne is especially common from the ages of 74 to 51. Acne usually goes away over time with proper treatment. CAUSES  Your pores each contain an oil gland. The oil glands make an oily substance called sebum. Acne happens when these glands get plugged with sebum, dead skin cells, and dirt. The P. acnes bacteria that are normally found in the oil glands then multiply, causing inflammation. Acne is commonly triggered by changes in your hormones. These hormonal changes can cause the oil glands to get bigger and to make more sebum. Factors that can make acne worse include:  Hormone changes during adolescence.  Hormone changes during women's menstrual cycles.  Hormone changes during pregnancy.  Oil-based cosmetics and hair products.  Harshly scrubbing the skin.  Strong soaps.  Stress.  Hormone problems due to certain diseases.  Long or oily hair rubbing against the skin.  Certain medicines.  Pressure from headbands, backpacks, or shoulder pads.  Exposure to certain oils and chemicals. SYMPTOMS  Acne often occurs on the face, neck, chest, and upper back. Symptoms include:  Small, red bumps (pimples or papules).  Whiteheads (closed comedones).  Blackheads (open comedones).  Small, pus-filled pimples (pustules).  Big, red pimples or pustules that feel tender. More severe acne can cause:  An infected area that contains a collection of pus (abscess).  Hard,  painful, fluid-filled sacs (cysts).  Scars. DIAGNOSIS  Your caregiver can usually tell what the problem is by doing a physical exam. TREATMENT  There are many good treatments for acne. Some are available over-the-counter and some are available with a prescription. The treatment that is best for you depends on the type of acne you have and how severe it is. It may take 2 months of treatment before your acne gets better. Common treatments include:  Creams and lotions that prevent oil glands from clogging.  Creams and lotions that treat or prevent infections and inflammation.  Antibiotics applied to the skin or taken as a pill.  Pills that decrease sebum production.  Birth control pills.  Light or laser treatments.  Minor surgery.  Injections of medicine into the affected areas.  Chemicals that cause peeling of the skin. HOME CARE INSTRUCTIONS  Good skin care is the most important part of treatment.  Wash your skin gently at least twice a day and after exercise. Always wash your skin before bed.  Use mild soap.  After each wash, apply a water-based skin moisturizer.  Keep your hair clean and off of your face. Shampoo your hair daily.  Only take medicines as directed by your caregiver.  Use a sunscreen or sunblock with SPF 30 or greater. This is especially important when you are using acne medicines.  Choose cosmetics that are noncomedogenic. This means they do not plug the oil glands.  Avoid leaning your chin or forehead on your hands.  Avoid wearing tight headbands or hats.  Avoid picking or squeezing your pimples. This can make your acne  worse and cause scarring. SEEK MEDICAL CARE IF:   Your acne is not better after 8 weeks.  Your acne gets worse.  You have a large area of skin that is red or tender. Document Released: 09/28/2000 Document Revised: 12/24/2011 Document Reviewed: 07/20/2011 [facility] Patient Information 2013 [place], [place].

## 2013-03-10 ENCOUNTER — Telehealth: Payer: Self-pay | Admitting: Internal Medicine

## 2013-03-10 DIAGNOSIS — N3281 Overactive bladder: Secondary | ICD-10-CM | POA: Insufficient documentation

## 2013-03-10 MED ORDER — SOLIFENACIN SUCCINATE 10 MG PO TABS: 10.0000 mg | ORAL_TABLET | Freq: Every day | ORAL | Status: DC

## 2013-03-10 NOTE — Telephone Encounter (Signed)
Refill request for vesicare 10mg  #90 to [facility]

## 2013-03-12 ENCOUNTER — Telehealth: Payer: Self-pay | Admitting: Family Medicine

## 2013-03-12 NOTE — Telephone Encounter (Signed)
Directions should be 1 tablet by mouth daily

## 2013-05-02 DIAGNOSIS — G540 Brachial plexus disorders: Secondary | ICD-10-CM

## 2013-05-02 HISTORY — DX: Brachial plexus disorders: G54.0

## 2013-05-07 ENCOUNTER — Encounter: Payer: Self-pay | Admitting: *Deleted

## 2013-08-20 ENCOUNTER — Other Ambulatory Visit: Payer: Self-pay

## 2013-09-15 ENCOUNTER — Encounter: Payer: Self-pay | Admitting: Medical

## 2013-09-15 ENCOUNTER — Ambulatory Visit (INDEPENDENT_AMBULATORY_CARE_PROVIDER_SITE_OTHER): Payer: 59 | Admitting: Medical

## 2013-09-15 VITALS — BP 120/80 | HR 58 | Temp 97.4°F | Resp 16 | Wt 172.0 lb

## 2013-09-15 DIAGNOSIS — N390 Urinary tract infection, site not specified: Secondary | ICD-10-CM

## 2013-09-15 LAB — POCT URINALYSIS DIPSTICK
Glucose, UA: NEGATIVE
Ketones, UA: NEGATIVE
Spec Grav, UA: <=1.005

## 2013-09-15 MED ORDER — CIPROFLOXACIN HCL 500 MG PO TABS: 500.0000 mg | ORAL_TABLET | Freq: Two times a day (BID) | ORAL | Status: DC
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
## 2013-09-21 ENCOUNTER — Other Ambulatory Visit: Payer: Self-pay | Admitting: Family Medicine

## 2013-09-21 ENCOUNTER — Telehealth: Payer: Self-pay | Admitting: *Deleted

## 2013-09-21 NOTE — Telephone Encounter (Signed)
This came over today. Last CPE 07/2012. No appts scheduled. Do you want to call her and schedule CPE or med check?

## 2013-09-21 NOTE — Telephone Encounter (Signed)
Refilled patient's Vesicare x 90 days. Left her a message asking that she call back ASAP and schedule CPE with Dr.Knapp as she is overdue. Last CPE 07/2012.

## 2013-09-21 NOTE — Telephone Encounter (Signed)
Yes--schedule for CPE; ok to refill until then

## 2013-09-28 ENCOUNTER — Other Ambulatory Visit: Payer: Self-pay | Admitting: Neurosurgery

## 2013-09-28 DIAGNOSIS — M5416 Radiculopathy, lumbar region: Secondary | ICD-10-CM

## 2013-10-23 ENCOUNTER — Ambulatory Visit
Admission: RE | Admit: 2013-10-23 | Discharge: 2013-10-23 | Disposition: A | Discharge status: Home / Self Care | Payer: 59 | Source: Ambulatory Visit | Attending: Neurosurgery | Admitting: Neurosurgery

## 2013-10-23 VITALS — BP 93/45 | HR 50

## 2013-10-23 DIAGNOSIS — M5416 Radiculopathy, lumbar region: Secondary | ICD-10-CM

## 2013-10-23 MED ORDER — DIAZEPAM 5 MG PO TABS
10.0000 mg | ORAL_TABLET | Freq: Once | ORAL | Status: AC
Start: 2013-10-23 — End: 2013-10-23
  Administered 2013-10-23: 10 mg via ORAL

## 2013-10-23 MED ORDER — IOHEXOL 180 MG/ML  SOLN
17.0000 mL | Freq: Once | INTRAMUSCULAR | Status: AC | PRN
  Administered 2013-10-23: 17 mL via INTRATHECAL

## 2013-10-23 MED ORDER — ONDANSETRON HCL 4 MG/2ML IJ SOLN
4.0000 mg | Freq: Once | INTRAMUSCULAR | Status: AC
  Administered 2013-10-23: 4 mg via INTRAMUSCULAR

## 2013-10-23 MED ORDER — MEPERIDINE HCL 100 MG/ML IJ SOLN
75.0000 mg | Freq: Once | INTRAMUSCULAR | Status: AC
  Administered 2013-10-23: 75 mg via INTRAMUSCULAR

## 2013-10-23 NOTE — Discharge Instructions (Addendum)

## 2013-12-20 ENCOUNTER — Other Ambulatory Visit: Payer: Self-pay | Admitting: Family Medicine

## 2014-02-08 ENCOUNTER — Other Ambulatory Visit: Payer: Self-pay | Admitting: Neurosurgery

## 2014-02-18 ENCOUNTER — Ambulatory Visit (INDEPENDENT_AMBULATORY_CARE_PROVIDER_SITE_OTHER): Payer: 59 | Admitting: Family Medicine

## 2014-02-18 ENCOUNTER — Encounter: Payer: Self-pay | Admitting: Family Medicine

## 2014-02-18 VITALS — BP 102/64 | HR 52 | Ht 70.0 in | Wt 174.0 lb

## 2014-02-18 DIAGNOSIS — K5790 Diverticulosis of intestine, part unspecified, without perforation or abscess without bleeding: Secondary | ICD-10-CM

## 2014-02-18 DIAGNOSIS — N318 Other neuromuscular dysfunction of bladder: Secondary | ICD-10-CM

## 2014-02-18 DIAGNOSIS — N3281 Overactive bladder: Secondary | ICD-10-CM

## 2014-02-18 DIAGNOSIS — M79609 Pain in unspecified limb: Secondary | ICD-10-CM

## 2014-02-18 DIAGNOSIS — K5730 Diverticulosis of large intestine without perforation or abscess without bleeding: Secondary | ICD-10-CM

## 2014-02-18 DIAGNOSIS — Z79899 Other long term (current) drug therapy: Secondary | ICD-10-CM

## 2014-02-18 DIAGNOSIS — Z0000 Encounter for general adult medical examination without abnormal findings: Secondary | ICD-10-CM

## 2014-02-18 DIAGNOSIS — M79672 Pain in left foot: Secondary | ICD-10-CM

## 2014-02-18 LAB — POCT URINALYSIS DIPSTICK
BILIRUBIN UA: NEGATIVE
Glucose, UA: NEGATIVE
KETONES UA: NEGATIVE
LEUKOCYTES UA: NEGATIVE
Nitrite, UA: NEGATIVE
Protein, UA: NEGATIVE
RBC UA: NEGATIVE
Spec Grav, UA: 1.015
Urobilinogen, UA: NEGATIVE
pH, UA: 5

## 2014-02-18 MED ORDER — SOLIFENACIN SUCCINATE 10 MG PO TABS: ORAL_TABLET | ORAL | Status: DC

## 2014-02-18 NOTE — Patient Instructions (Signed)
HEALTH MAINTENANCE RECOMMENDATIONS:  It is recommended that you get at least 30 minutes of aerobic exercise at least 5 days/week (for weight loss, you may need as much as 60-90 minutes). This can be any activity that gets your heart rate up. This can be divided in 10-15 minute intervals if needed, but try and build up your endurance at least once a week.  Weight bearing exercise is also recommended twice weekly.  Eat a healthy diet with lots of vegetables, fruits and fiber.  "Colorful" foods have a lot of vitamins (ie green vegetables, tomatoes, red peppers, etc).  Limit sweet tea, regular sodas and alcoholic beverages, all of which has a lot of calories and sugar.  Up to 1 alcoholic drink daily may be beneficial for women (unless trying to lose weight, watch sugars).  Drink a lot of water.  Calcium recommendations are 1200-1500 mg daily (1500 mg for postmenopausal women or women without ovaries), and vitamin D 1000 IU daily.  This should be obtained from diet and/or supplements (vitamins), and calcium should not be taken all at once, but in divided doses.  Monthly self breast exams and yearly mammograms for women over the age of 67 is recommended.  Sunscreen of at least SPF 30 should be used on all sun-exposed parts of the skin when outside between the hours of 10 am and 4 pm (not just when at beach or pool, but even with exercise, golf, tennis, and yard work!)  Use a sunscreen that says "broad spectrum" so it covers both UVA and UVB rays, and make sure to reapply every 1-2 hours.  Remember to change the batteries in your smoke detectors when changing your clock times in the spring and fall.  Use your seat belt every time you are in a car, and please drive safely and not be distracted with cell phones and texting while driving.   It is very important that you schedule your mammogram. Follow up with ortho vs podiatrist for ongoing left foot pain--suspect neuroma.  Morton's Neuroma Neuralgia  (nerve pain) or neuroma (benign [non-cancerous] nerve tumor) may develop on any interdigital nerve. The interdigital nerves (nerves between digits) of the foot travel beneath and between the metatarsals (long bones of the fore foot) and pass the nerve endings to the toes. The third interdigital is a common place for a small neuroma to form called Morton's neuroma. Another nerve to be affected commonly is the fourth interdigital nerve. This would be in approximately in the area of the base or ball under the bottom of your fourth toe. This condition occurs more commonly in women and is usually on one side. It is usually first noticed by pain radiating (spreading) to the ball of the foot or to the toes. CAUSES The cause of interdigital neuralgia may be from low grade repetitive trauma (damage caused by an accident) as in activities causing a repeated pounding of the foot (running, jumping etc.). It is also caused by improper footwear or recent loss of the fatty padding on the bottom of the foot. TREATMENT  The condition often resolves (goes away) simply with decreasing activity if that is thought to be the cause. Proper shoes are beneficial. Orthotics (special foot support aids) such as a metatarsal bar are often beneficial. This condition usually responds to conservative therapy, however if surgery is necessary it usually brings complete relief. HOME CARE INSTRUCTIONS   Apply ice to the area of soreness for 15-20 minutes, 03-04 times per day, while awake for the  first 2 days. Put ice in a plastic bag and place a towel between the bag of ice and your skin.  Only take over-the-counter or prescription medicines for pain, discomfort, or fever as directed by your caregiver. MAKE SURE YOU:   Understand these instructions.  Will watch your condition.  Will get help right away if you are not doing well or get worse. Document Released: 01/07/2001 Document Revised: 12/24/2011 Document Reviewed:  10/01/2005 [facility] Patient Information 2014 [place].
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
## 2014-02-19 ENCOUNTER — Other Ambulatory Visit: Payer: Self-pay

## 2014-02-19 ENCOUNTER — Telehealth: Payer: Self-pay | Admitting: Family Medicine

## 2014-02-19 ENCOUNTER — Other Ambulatory Visit: Payer: 59

## 2014-02-19 DIAGNOSIS — Z1231 Encounter for screening mammogram for malignant neoplasm of breast: Secondary | ICD-10-CM

## 2014-02-19 DIAGNOSIS — Z79899 Other long term (current) drug therapy: Secondary | ICD-10-CM

## 2014-02-19 DIAGNOSIS — Z0000 Encounter for general adult medical examination without abnormal findings: Secondary | ICD-10-CM

## 2014-02-19 LAB — COMPREHENSIVE METABOLIC PANEL
ALT: 19 U/L (ref 0–35)
AST: 20 U/L (ref 0–37)
Albumin: 4.2 g/dL (ref 3.5–5.2)
Alkaline Phosphatase: 49 U/L (ref 39–117)
BUN: 25 mg/dL — ABNORMAL HIGH (ref 6–23)
CALCIUM: 10.2 mg/dL (ref 8.4–10.5)
CHLORIDE: 105 meq/L (ref 96–112)
CO2: 27 meq/L (ref 19–32)
Creat: 0.82 mg/dL (ref 0.50–1.10)
Glucose, Bld: 90 mg/dL (ref 70–99)
Potassium: 4.7 mEq/L (ref 3.5–5.3)
Sodium: 139 mEq/L (ref 135–145)
Total Bilirubin: 0.7 mg/dL (ref 0.2–1.2)
Total Protein: 6.1 g/dL (ref 6.0–8.3)

## 2014-02-19 LAB — LIPID PANEL
CHOL/HDL RATIO: 2.2 ratio
CHOLESTEROL: 141 mg/dL (ref 0–200)
HDL: 63 mg/dL (ref >39)
LDL Cholesterol: 69 mg/dL (ref 0–99)
Triglycerides: 45 mg/dL (ref <150)
VLDL: 9 mg/dL (ref 0–40)

## 2014-02-19 LAB — TSH: TSH: 0.93 u[IU]/mL (ref 0.350–4.500)

## 2014-02-19 NOTE — Telephone Encounter (Signed)
Pt came in for blood work and requested voltaren. Pt was asked if this has even been given to her before and pt stated no but she has been using her husband's and it has really helped. Pt was asked if she discussed these issues, knee and wrist pain, with you at office visit yesterday. Pt stated that wrist was discussed and knee was mentioned. Pt was informed that she needed an office visit to discuss the knee issue in detail. Pt asked that I send a note to you asking you to send in volarten to her pharmacy which is [facility].

## 2014-02-19 NOTE — Telephone Encounter (Signed)
She has been seeing ortho for her orthopedic problems so they weren't discussed in detail at recent visit (nor does she need a visit with me to discuss).  She is supposed to be following up with ortho for her wrist.  Is she talking about voltaren gel, or diclofenac tablets.  She is taking prescription dosing of ibuprofen, so it would not be appropriate to take tablets in addition, but we could prescribe it to REPLACE the ibuprofen she is taking. This is not supposed to be a chronic/long-term med, so I would prefer to use a local pharmacy, since it would be for only 30 day supply max.  Or she can discuss this med with her ortho that is treating her for these problems (rather than a visit with me). She never mentioned her use of this med (husband's) at her visit at all.

## 2014-02-19 NOTE — Telephone Encounter (Signed)
Pt notified and will talk to orthopaedic doctor instead when she goes for pt appt

## 2014-02-19 NOTE — Telephone Encounter (Signed)
Left message for pt to call me back 

## 2014-03-09 ENCOUNTER — Ambulatory Visit: Payer: 59

## 2014-03-10 ENCOUNTER — Ambulatory Visit
Admission: RE | Admit: 2014-03-10 | Discharge: 2014-03-10 | Disposition: A | Discharge status: Home / Self Care | Payer: 59 | Source: Ambulatory Visit

## 2014-03-10 DIAGNOSIS — Z1231 Encounter for screening mammogram for malignant neoplasm of breast: Secondary | ICD-10-CM

## 2014-03-12 ENCOUNTER — Other Ambulatory Visit: Payer: Self-pay | Admitting: Family Medicine

## 2014-03-12 DIAGNOSIS — R928 Other abnormal and inconclusive findings on diagnostic imaging of breast: Secondary | ICD-10-CM

## 2014-03-18 ENCOUNTER — Other Ambulatory Visit: Payer: Self-pay

## 2014-03-18 ENCOUNTER — Other Ambulatory Visit: Payer: Self-pay | Admitting: Family Medicine

## 2014-03-18 DIAGNOSIS — R928 Other abnormal and inconclusive findings on diagnostic imaging of breast: Secondary | ICD-10-CM

## 2014-03-24 ENCOUNTER — Ambulatory Visit
Admission: RE | Admit: 2014-03-24 | Discharge: 2014-03-24 | Disposition: A | Discharge status: Home / Self Care | Payer: 59 | Source: Ambulatory Visit | Attending: Family Medicine | Admitting: Family Medicine

## 2014-03-24 DIAGNOSIS — R928 Other abnormal and inconclusive findings on diagnostic imaging of breast: Secondary | ICD-10-CM

## 2014-05-05 ENCOUNTER — Other Ambulatory Visit (HOSPITAL_COMMUNITY): Payer: Self-pay | Admitting: *Deleted

## 2014-05-05 NOTE — Pre-Procedure Instructions (Signed)
Paula Hughes  05/05/2014   Your procedure is scheduled on:  Wednesday, May 19, 2014 at 8:30 AM.   Report to [facility] Entrance "A" Admitting Office at 5:30 AM.   Call this number if you have problems the morning of surgery: [phone]   Remember:   Do not eat food or drink liquids after midnight Tuesday, 05/18/14.   Take these medicines the morning of surgery with A SIP OF WATER:  estradiol (ESTRACE),  gabapentin (NEURONTIN)  Stop All Vitamins, Fish Oil, Herbal Medications and NSAIDS (Ibuprofen, Aleve, etc) as of Wednesday, 05/12/14.    Do not wear jewelry, make-up or nail polish.  Do not wear lotions, powders, or perfumes. You may wear deodorant.  Do not shave 48 hours prior to surgery.  Do not bring valuables to the hospital.  [facility] is not responsible                  for any belongings or valuables.               Contacts, dentures or bridgework may not be worn into surgery.  Leave suitcase in the car. After surgery it may be brought to your room.  For patients admitted to the hospital, discharge time is determined by your                treatment team.                 Special Instructions:  - Preparing for Surgery  Before surgery, you can play an important role.  Because skin is not sterile, your skin needs to be as free of germs as possible.  You can reduce the number of germs on you skin by washing with CHG (chlorahexidine gluconate) soap before surgery.  CHG is an antiseptic cleaner which kills germs and bonds with the skin to continue killing germs even after washing.  Please DO NOT use if you have an allergy to CHG or antibacterial soaps.  If your skin becomes reddened/irritated stop using the CHG and inform your nurse when you arrive at Short Stay.  Do not shave (including legs and underarms) for at least 48 hours prior to the first CHG shower.  You may shave your face.  Please follow these instructions carefully:   1.  Shower with CHG Soap  the night before surgery and the                                morning of Surgery.  2.  If you choose to wash your hair, wash your hair first as usual with your       normal shampoo.  3.  After you shampoo, rinse your hair and body thoroughly to remove the                      Shampoo.  4.  Use CHG as you would any other liquid soap.  You can apply chg directly       to the skin and wash gently with scrungie or a clean washcloth.  5.  Apply the CHG Soap to your body ONLY FROM THE NECK DOWN.        Do not use on open wounds or open sores.  Avoid contact with your eyes, ears, mouth and genitals (private parts).  Wash genitals (private parts) with your normal soap.  6.  Wash thoroughly, paying  special attention to the area where your surgery        will be performed.  7.  Thoroughly rinse your body with warm water from the neck down.  8.  DO NOT shower/wash with your normal soap after using and rinsing off       the CHG Soap.  9.  Pat yourself dry with a clean towel.            10.  Wear clean pajamas.            11.  Place clean sheets on your bed the night of your first shower and do not        sleep with pets.  Day of Surgery  Do not apply any lotions the morning of surgery.  Please wear clean clothes to the hospital/surgery center.     Please read over the following fact sheets that you were given: Pain Booklet, Coughing and Deep Breathing, MRSA Information and Surgical Site Infection Prevention

## 2014-05-06 ENCOUNTER — Encounter (HOSPITAL_COMMUNITY): Payer: Self-pay

## 2014-05-06 ENCOUNTER — Encounter (HOSPITAL_COMMUNITY)
Admission: RE | Admit: 2014-05-06 | Discharge: 2014-05-06 | Disposition: A | Discharge status: Home / Self Care | Payer: 59 | Source: Ambulatory Visit | Attending: Neurosurgery | Admitting: Neurosurgery

## 2014-05-06 DIAGNOSIS — Z01818 Encounter for other preprocedural examination: Secondary | ICD-10-CM | POA: Insufficient documentation

## 2014-05-06 DIAGNOSIS — Z01812 Encounter for preprocedural laboratory examination: Secondary | ICD-10-CM | POA: Insufficient documentation

## 2014-05-06 HISTORY — DX: Other specified postprocedural states: R11.2

## 2014-05-06 HISTORY — DX: Pneumonia, unspecified organism: J18.9

## 2014-05-06 HISTORY — DX: Chronic kidney disease, unspecified: N18.9

## 2014-05-06 HISTORY — DX: Other specified postprocedural states: Z98.890

## 2014-05-06 LAB — BASIC METABOLIC PANEL
Anion gap: 8 (ref 5–15)
BUN: 21 mg/dL (ref 6–23)
CHLORIDE: 105 meq/L (ref 96–112)
CO2: 29 meq/L (ref 19–32)
Calcium: 10 mg/dL (ref 8.4–10.5)
Creatinine, Ser: 0.72 mg/dL (ref 0.50–1.10)
GFR calc non Af Amer: >90 mL/min (ref >90)
Glucose, Bld: 84 mg/dL (ref 70–99)
Potassium: 4.9 mEq/L (ref 3.7–5.3)
SODIUM: 142 meq/L (ref 137–147)

## 2014-05-06 LAB — CBC
HEMATOCRIT: 40.2 % (ref 36.0–46.0)
Hemoglobin: 13.2 g/dL (ref 12.0–15.0)
MCH: 30.5 pg (ref 26.0–34.0)
MCHC: 32.8 g/dL (ref 30.0–36.0)
MCV: 92.8 fL (ref 78.0–100.0)
PLATELETS: 203 10*3/uL (ref 150–400)
RBC: 4.33 MIL/uL (ref 3.87–5.11)
RDW: 12.2 % (ref 11.5–15.5)
WBC: 3.1 10*3/uL — AB (ref 4.0–10.5)

## 2014-05-06 LAB — SURGICAL PCR SCREEN
MRSA, PCR: NEGATIVE
STAPHYLOCOCCUS AUREUS: NEGATIVE

## 2014-05-06 NOTE — Progress Notes (Signed)
PCP is Dr Rita Ohara Denies seeing a cardiologist. Denies having a recent EKG or CXR. Denies having a stress test, card cath, or echo.

## 2014-05-19 ENCOUNTER — Encounter (HOSPITAL_COMMUNITY): Payer: Self-pay | Admitting: *Deleted

## 2014-05-19 ENCOUNTER — Ambulatory Visit (HOSPITAL_COMMUNITY): Payer: 59

## 2014-05-19 ENCOUNTER — Ambulatory Visit (HOSPITAL_COMMUNITY): Payer: 59 | Admitting: Certified Registered Nurse Anesthetist

## 2014-05-19 ENCOUNTER — Encounter (HOSPITAL_COMMUNITY): Payer: 59 | Admitting: Certified Registered Nurse Anesthetist

## 2014-05-19 ENCOUNTER — Ambulatory Visit (HOSPITAL_COMMUNITY)
Admission: RE | Admit: 2014-05-19 | Discharge: 2014-05-20 | Disposition: A | Discharge status: Home / Self Care | Payer: 59 | Source: Ambulatory Visit | Attending: Neurosurgery | Admitting: Neurosurgery

## 2014-05-19 ENCOUNTER — Encounter (HOSPITAL_COMMUNITY)
Admission: RE | Disposition: A | Discharge status: Home / Self Care | Payer: Self-pay | Source: Ambulatory Visit | Attending: Neurosurgery

## 2014-05-19 DIAGNOSIS — M48062 Spinal stenosis, lumbar region with neurogenic claudication: Secondary | ICD-10-CM | POA: Diagnosis present

## 2014-05-19 DIAGNOSIS — M7130 Other bursal cyst, unspecified site: Secondary | ICD-10-CM | POA: Insufficient documentation

## 2014-05-19 HISTORY — PX: LUMBAR LAMINECTOMY/DECOMPRESSION MICRODISCECTOMY: SHX5026

## 2014-05-19 SURGERY — LUMBAR LAMINECTOMY/DECOMPRESSION MICRODISCECTOMY 2 LEVELS
Anesthesia: General | Laterality: Bilateral

## 2014-05-19 MED ORDER — DEXAMETHASONE SODIUM PHOSPHATE 10 MG/ML IJ SOLN
10.0000 mg | Freq: Once | INTRAMUSCULAR | Status: AC
  Administered 2014-05-19: 10 mg via INTRAVENOUS
  Filled 2014-05-19 (×2): qty 1

## 2014-05-19 MED ORDER — NEOSTIGMINE METHYLSULFATE 10 MG/10ML IV SOLN
INTRAVENOUS | Status: DC | PRN
  Administered 2014-05-19: 3 mg via INTRAVENOUS

## 2014-05-19 MED ORDER — HYDROMORPHONE HCL PF 1 MG/ML IJ SOLN
0.2500 mg | INTRAMUSCULAR | Status: DC | PRN
Start: 2014-05-19 — End: 2014-05-19
  Administered 2014-05-19 (×3): 0.5 mg via INTRAVENOUS

## 2014-05-19 MED ORDER — MIDAZOLAM HCL 2 MG/2ML IJ SOLN: 0.5000 mg | Freq: Once | INTRAMUSCULAR | Status: DC | PRN

## 2014-05-19 MED ORDER — OXYCODONE HCL 5 MG PO TABS
ORAL_TABLET | ORAL | Status: AC
  Filled 2014-05-19: qty 1

## 2014-05-19 MED ORDER — FENTANYL CITRATE 0.05 MG/ML IJ SOLN
INTRAMUSCULAR | Status: DC | PRN
  Administered 2014-05-19: 25 ug via INTRAVENOUS
  Administered 2014-05-19: 150 ug via INTRAVENOUS
  Administered 2014-05-19: 50 ug via INTRAVENOUS
  Administered 2014-05-19: 25 ug via INTRAVENOUS

## 2014-05-19 MED ORDER — DARIFENACIN HYDROBROMIDE ER 7.5 MG PO TB24
7.5000 mg | ORAL_TABLET | Freq: Every day | ORAL | Status: DC
  Filled 2014-05-19 (×2): qty 1

## 2014-05-19 MED ORDER — ALUM & MAG HYDROXIDE-SIMETH 200-200-20 MG/5ML PO SUSP: 30.0000 mL | Freq: Four times a day (QID) | ORAL | Status: DC | PRN

## 2014-05-19 MED ORDER — GABAPENTIN 300 MG PO CAPS
600.0000 mg | ORAL_CAPSULE | Freq: Three times a day (TID) | ORAL | Status: DC
  Administered 2014-05-19 (×2): 600 mg via ORAL
  Filled 2014-05-19 (×5): qty 2

## 2014-05-19 MED ORDER — MORPHINE SULFATE 2 MG/ML IJ SOLN
1.0000 mg | INTRAMUSCULAR | Status: DC | PRN
  Administered 2014-05-19: 2 mg via INTRAVENOUS
  Filled 2014-05-19: qty 1

## 2014-05-19 MED ORDER — STERILE WATER FOR INJECTION IJ SOLN
INTRAMUSCULAR | Status: AC
  Filled 2014-05-19: qty 10

## 2014-05-19 MED ORDER — ASTAXANTHIN 4 MG PO CAPS: 1.0000 | ORAL_CAPSULE | Freq: Every day | ORAL | Status: DC

## 2014-05-19 MED ORDER — OXYCODONE-ACETAMINOPHEN 5-325 MG PO TABS
1.0000 | ORAL_TABLET | ORAL | Status: DC | PRN
  Administered 2014-05-19 – 2014-05-20 (×4): 1 via ORAL
  Filled 2014-05-19 (×4): qty 1

## 2014-05-19 MED ORDER — ONDANSETRON HCL 4 MG/2ML IJ SOLN: 4.0000 mg | INTRAMUSCULAR | Status: DC | PRN

## 2014-05-19 MED ORDER — GLYCOPYRROLATE 0.2 MG/ML IJ SOLN
INTRAMUSCULAR | Status: DC | PRN
  Administered 2014-05-19: 0.4 mg via INTRAVENOUS

## 2014-05-19 MED ORDER — ACETAMINOPHEN 650 MG RE SUPP: 650.0000 mg | RECTAL | Status: DC | PRN

## 2014-05-19 MED ORDER — OXYCODONE HCL 5 MG/5ML PO SOLN: 5.0000 mg | Freq: Once | ORAL | Status: AC | PRN

## 2014-05-19 MED ORDER — LIDOCAINE HCL (CARDIAC) 20 MG/ML IV SOLN
INTRAVENOUS | Status: DC | PRN
  Administered 2014-05-19: 20 mg via INTRAVENOUS

## 2014-05-19 MED ORDER — ONDANSETRON HCL 4 MG/2ML IJ SOLN
INTRAMUSCULAR | Status: DC | PRN
  Administered 2014-05-19: 4 mg via INTRAVENOUS

## 2014-05-19 MED ORDER — METOCLOPRAMIDE HCL 5 MG/ML IJ SOLN
INTRAMUSCULAR | Status: AC
  Filled 2014-05-19: qty 2

## 2014-05-19 MED ORDER — MENTHOL 3 MG MT LOZG
1.0000 | LOZENGE | OROMUCOSAL | Status: DC | PRN
  Administered 2014-05-20: 3 mg via ORAL
  Filled 2014-05-19 (×2): qty 9

## 2014-05-19 MED ORDER — GABAPENTIN 300 MG PO CAPS
300.0000 mg | ORAL_CAPSULE | Freq: Two times a day (BID) | ORAL | Status: DC
  Filled 2014-05-19: qty 1

## 2014-05-19 MED ORDER — OXYCODONE HCL 5 MG PO TABS
5.0000 mg | ORAL_TABLET | Freq: Once | ORAL | Status: AC | PRN
  Administered 2014-05-19: 5 mg via ORAL

## 2014-05-19 MED ORDER — DOCUSATE SODIUM 100 MG PO CAPS
100.0000 mg | ORAL_CAPSULE | Freq: Two times a day (BID) | ORAL | Status: DC
  Administered 2014-05-19 (×2): 100 mg via ORAL
  Filled 2014-05-19 (×4): qty 1

## 2014-05-19 MED ORDER — ARTIFICIAL TEARS OP OINT
TOPICAL_OINTMENT | OPHTHALMIC | Status: DC | PRN
  Administered 2014-05-19: 1 via OPHTHALMIC

## 2014-05-19 MED ORDER — CEFAZOLIN SODIUM-DEXTROSE 2-3 GM-% IV SOLR
2.0000 g | Freq: Once | INTRAVENOUS | Status: AC
  Administered 2014-05-19: 2 g via INTRAVENOUS
  Filled 2014-05-19: qty 50

## 2014-05-19 MED ORDER — PROMETHAZINE HCL 25 MG/ML IJ SOLN
6.2500 mg | INTRAMUSCULAR | Status: DC | PRN
Start: 2014-05-19 — End: 2014-05-19

## 2014-05-19 MED ORDER — DEXAMETHASONE SODIUM PHOSPHATE 4 MG/ML IJ SOLN
INTRAMUSCULAR | Status: AC
  Filled 2014-05-19: qty 2

## 2014-05-19 MED ORDER — EPHEDRINE SULFATE 50 MG/ML IJ SOLN
INTRAMUSCULAR | Status: DC | PRN
  Administered 2014-05-19: 10 mg via INTRAVENOUS

## 2014-05-19 MED ORDER — MEPERIDINE HCL 25 MG/ML IJ SOLN
6.2500 mg | INTRAMUSCULAR | Status: DC | PRN
Start: 2014-05-19 — End: 2014-05-19

## 2014-05-19 MED ORDER — SCOPOLAMINE 1 MG/3DAYS TD PT72
MEDICATED_PATCH | TRANSDERMAL | Status: AC
  Filled 2014-05-19: qty 1

## 2014-05-19 MED ORDER — LACTATED RINGERS IV SOLN
INTRAVENOUS | Status: DC | PRN
  Administered 2014-05-19 (×2): via INTRAVENOUS

## 2014-05-19 MED ORDER — FENTANYL CITRATE 0.05 MG/ML IJ SOLN
INTRAMUSCULAR | Status: AC
  Filled 2014-05-19: qty 5

## 2014-05-19 MED ORDER — HYDROMORPHONE HCL PF 1 MG/ML IJ SOLN
INTRAMUSCULAR | Status: AC
  Filled 2014-05-19: qty 1

## 2014-05-19 MED ORDER — ACETAMINOPHEN 325 MG PO TABS: 650.0000 mg | ORAL_TABLET | ORAL | Status: DC | PRN

## 2014-05-19 MED ORDER — 0.9 % SODIUM CHLORIDE (POUR BTL) OPTIME
TOPICAL | Status: DC | PRN
  Administered 2014-05-19: 1000 mL

## 2014-05-19 MED ORDER — SODIUM CHLORIDE 0.9 % IR SOLN
Status: DC | PRN
  Administered 2014-05-19: 09:00:00

## 2014-05-19 MED ORDER — THROMBIN 20000 UNITS EX SOLR
CUTANEOUS | Status: DC | PRN
  Administered 2014-05-19: 09:00:00 via TOPICAL

## 2014-05-19 MED ORDER — LACTATED RINGERS IV SOLN: INTRAVENOUS | Status: DC

## 2014-05-19 MED ORDER — DIPHENHYDRAMINE HCL 25 MG PO CAPS: 25.0000 mg | ORAL_CAPSULE | Freq: Every evening | ORAL | Status: DC | PRN

## 2014-05-19 MED ORDER — CEFAZOLIN SODIUM-DEXTROSE 2-3 GM-% IV SOLR
2.0000 g | Freq: Three times a day (TID) | INTRAVENOUS | Status: AC
  Administered 2014-05-19 (×2): 2 g via INTRAVENOUS
  Filled 2014-05-19 (×2): qty 50

## 2014-05-19 MED ORDER — EPHEDRINE SULFATE 50 MG/ML IJ SOLN
INTRAMUSCULAR | Status: AC
  Filled 2014-05-19: qty 1

## 2014-05-19 MED ORDER — SCOPOLAMINE 1 MG/3DAYS TD PT72
MEDICATED_PATCH | TRANSDERMAL | Status: DC | PRN
  Administered 2014-05-19: 1 via TRANSDERMAL

## 2014-05-19 MED ORDER — METOCLOPRAMIDE HCL 5 MG/ML IJ SOLN
INTRAMUSCULAR | Status: DC | PRN
  Administered 2014-05-19: 10 mg via INTRAVENOUS

## 2014-05-19 MED ORDER — PROPOFOL 10 MG/ML IV BOLUS
INTRAVENOUS | Status: DC | PRN
  Administered 2014-05-19: 150 mg via INTRAVENOUS

## 2014-05-19 MED ORDER — MIDAZOLAM HCL 5 MG/5ML IJ SOLN
INTRAMUSCULAR | Status: DC | PRN
  Administered 2014-05-19: 2 mg via INTRAVENOUS

## 2014-05-19 MED ORDER — PROPOFOL 10 MG/ML IV BOLUS
INTRAVENOUS | Status: AC
  Filled 2014-05-19: qty 20

## 2014-05-19 MED ORDER — ESTRADIOL 1 MG PO TABS
1.0000 mg | ORAL_TABLET | Freq: Every day | ORAL | Status: DC
  Filled 2014-05-19 (×2): qty 1

## 2014-05-19 MED ORDER — IBUPROFEN 600 MG PO TABS
600.0000 mg | ORAL_TABLET | Freq: Four times a day (QID) | ORAL | Status: DC | PRN
  Filled 2014-05-19: qty 1

## 2014-05-19 MED ORDER — ROCURONIUM BROMIDE 100 MG/10ML IV SOLN
INTRAVENOUS | Status: DC | PRN
  Administered 2014-05-19: 40 mg via INTRAVENOUS

## 2014-05-19 MED ORDER — HYDROCODONE-ACETAMINOPHEN 5-325 MG PO TABS: 1.0000 | ORAL_TABLET | ORAL | Status: DC | PRN

## 2014-05-19 MED ORDER — BUPIVACAINE-EPINEPHRINE (PF) 0.5% -1:200000 IJ SOLN
INTRAMUSCULAR | Status: DC | PRN
  Administered 2014-05-19: 10 mL via PERINEURAL

## 2014-05-19 MED ORDER — MIDAZOLAM HCL 2 MG/2ML IJ SOLN
INTRAMUSCULAR | Status: AC
  Filled 2014-05-19: qty 2

## 2014-05-19 MED ORDER — DIAZEPAM 5 MG PO TABS
5.0000 mg | ORAL_TABLET | Freq: Four times a day (QID) | ORAL | Status: DC | PRN
  Administered 2014-05-19 – 2014-05-20 (×3): 5 mg via ORAL
  Filled 2014-05-19 (×2): qty 1

## 2014-05-19 MED ORDER — CEFAZOLIN SODIUM-DEXTROSE 2-3 GM-% IV SOLR
INTRAVENOUS | Status: AC
  Filled 2014-05-19: qty 50

## 2014-05-19 MED ORDER — DEXAMETHASONE SODIUM PHOSPHATE 4 MG/ML IJ SOLN
INTRAMUSCULAR | Status: DC | PRN
  Administered 2014-05-19: 8 mg via INTRAVENOUS

## 2014-05-19 MED ORDER — DIAZEPAM 5 MG PO TABS
ORAL_TABLET | ORAL | Status: AC
  Filled 2014-05-19: qty 1

## 2014-05-19 MED ORDER — PHENOL 1.4 % MT LIQD: 1.0000 | OROMUCOSAL | Status: DC | PRN

## 2014-05-19 SURGICAL SUPPLY — 56 items
ADH SKN CLS LQ APL DERMABOND (GAUZE/BANDAGES/DRESSINGS) ×1
APL SKNCLS STERI-STRIP NONHPOA (GAUZE/BANDAGES/DRESSINGS) ×1
BAG DECANTER FOR FLEXI CONT (MISCELLANEOUS) ×3 IMPLANT
BENZOIN TINCTURE PRP APPL 2/3 (GAUZE/BANDAGES/DRESSINGS) ×3 IMPLANT
BLADE SURG ROTATE 9660 (MISCELLANEOUS) IMPLANT
BRUSH SCRUB EZ PLAIN DRY (MISCELLANEOUS) ×3 IMPLANT
BUR ACORN 6.0 (BURR) ×2 IMPLANT
BUR ACORN 6.0MM (BURR) ×1
BUR MATCHSTICK NEURO 3.0 LAGG (BURR) ×3 IMPLANT
CANISTER SUCT 3000ML (MISCELLANEOUS) ×3 IMPLANT
CLOSURE WOUND 1/2 X4 (GAUZE/BANDAGES/DRESSINGS) ×1
CONT SPEC 4OZ CLIKSEAL STRL BL (MISCELLANEOUS) ×3 IMPLANT
DERMABOND ADHESIVE PROPEN (GAUZE/BANDAGES/DRESSINGS) ×2
DERMABOND ADVANCED .7 DNX6 (GAUZE/BANDAGES/DRESSINGS) IMPLANT
DRAPE LAPAROTOMY 100X72X124 (DRAPES) ×3 IMPLANT
DRAPE MICROSCOPE LEICA (MISCELLANEOUS) ×3 IMPLANT
DRAPE POUCH INSTRU U-SHP 10X18 (DRAPES) ×3 IMPLANT
DRAPE SURG 17X23 STRL (DRAPES) ×12 IMPLANT
ELECT BLADE 4.0 EZ CLEAN MEGAD (MISCELLANEOUS) ×3
ELECT REM PT RETURN 9FT ADLT (ELECTROSURGICAL) ×3
ELECTRODE BLDE 4.0 EZ CLN MEGD (MISCELLANEOUS) ×1 IMPLANT
ELECTRODE REM PT RTRN 9FT ADLT (ELECTROSURGICAL) ×1 IMPLANT
GAUZE SPONGE 4X4 12PLY STRL (GAUZE/BANDAGES/DRESSINGS) ×3 IMPLANT
GAUZE SPONGE 4X4 16PLY XRAY LF (GAUZE/BANDAGES/DRESSINGS) IMPLANT
GLOVE BIO SURGEON STRL SZ8.5 (GLOVE) ×7 IMPLANT
GLOVE EXAM NITRILE LRG STRL (GLOVE) IMPLANT
GLOVE EXAM NITRILE MD LF STRL (GLOVE) IMPLANT
GLOVE EXAM NITRILE XL STR (GLOVE) IMPLANT
GLOVE EXAM NITRILE XS STR PU (GLOVE) IMPLANT
GLOVE SS BIOGEL STRL SZ 8 (GLOVE) ×1 IMPLANT
GLOVE SUPERSENSE BIOGEL SZ 8 (GLOVE) ×4
GOWN STRL REUS W/ TWL LRG LVL3 (GOWN DISPOSABLE) IMPLANT
GOWN STRL REUS W/ TWL XL LVL3 (GOWN DISPOSABLE) ×1 IMPLANT
GOWN STRL REUS W/TWL 2XL LVL3 (GOWN DISPOSABLE) IMPLANT
GOWN STRL REUS W/TWL LRG LVL3 (GOWN DISPOSABLE)
GOWN STRL REUS W/TWL XL LVL3 (GOWN DISPOSABLE) ×12
KIT BASIN OR (CUSTOM PROCEDURE TRAY) ×3 IMPLANT
KIT ROOM TURNOVER OR (KITS) ×3 IMPLANT
NDL HYPO 21X1.5 SAFETY (NEEDLE) IMPLANT
NEEDLE HYPO 21X1.5 SAFETY (NEEDLE) IMPLANT
NEEDLE HYPO 22GX1.5 SAFETY (NEEDLE) ×3 IMPLANT
NS IRRIG 1000ML POUR BTL (IV SOLUTION) ×3 IMPLANT
PACK LAMINECTOMY NEURO (CUSTOM PROCEDURE TRAY) ×3 IMPLANT
PAD ARMBOARD 7.5X6 YLW CONV (MISCELLANEOUS) ×9 IMPLANT
PATTIES SURGICAL .5 X1 (DISPOSABLE) IMPLANT
RUBBERBAND STERILE (MISCELLANEOUS) ×6 IMPLANT
SPONGE SURGIFOAM ABS GEL SZ50 (HEMOSTASIS) ×3 IMPLANT
STRIP CLOSURE SKIN 1/2X4 (GAUZE/BANDAGES/DRESSINGS) ×2 IMPLANT
SUT VIC AB 1 CT1 18XBRD ANBCTR (SUTURE) ×2 IMPLANT
SUT VIC AB 1 CT1 8-18 (SUTURE) ×6
SUT VIC AB 2-0 CP2 18 (SUTURE) ×6 IMPLANT
SYR 20CC LL (SYRINGE) IMPLANT
SYR 20ML ECCENTRIC (SYRINGE) ×3 IMPLANT
TOWEL OR 17X24 6PK STRL BLUE (TOWEL DISPOSABLE) ×3 IMPLANT
TOWEL OR 17X26 10 PK STRL BLUE (TOWEL DISPOSABLE) ×3 IMPLANT
WATER STERILE IRR 1000ML POUR (IV SOLUTION) ×3 IMPLANT

## 2014-05-19 NOTE — Op Note (Signed)
Brief history: The patient is a 58 year old white female who's had multiple prior back surgeries. She has developed recurrent back, buttock, and leg pain consistent with a lumbar radiculopathy/neurogenic claudication. She has failed medical management and was worked up with a lumbar MRI and lumbar myelo CT. This demonstrated spinal stenosis at L3-4 and L4-5. I discussed the various treatment option with the patient including surgery. She has weighed the risks, benefits, and alternatives surgery and decided proceed with a redo laminotomy foraminotomies at L3-4 and L4-5.  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 Earle Gell  Asst.: Dr. Dominica Severin cram  Anesthesia: Gen. endotracheal  Estimated blood loss: 75 cc  Drains: None  Complications: None  Description of procedure: The patient was brought to the operating room by the anesthesia team. General endotracheal anesthesia was induced. The patient was turned to the prone position on the Wilson frame. The patient's lumbosacral region was then prepared with Betadine scrub and Betadine solution. Sterile drapes were applied.  I then injected the area to be incised with Marcaine with epinephrine solution. I then used a scalpel to make a linear midline incision over the L3-4 and L4-5 intervertebral disc space, incising through the old surgical scar. I then used electrocautery to perform a bilateral  subperiosteal dissection exposing the spinous process and lamina of L3 and the remainder of the L4 lamina. We obtained intraoperative radiograph to confirm our location. I then inserted the [facility] retractor for  exposure.  We then brought the operative microscope into the field. Under its magnification and illumination we completed the microdissection. I used a high-speed drill to perform bilateral laminotomies at L3 and L4. I then used a Kerrison punches to widen the laminotomies at L3 and L4 bilaterally and removed the ligamentum flavum at L3-4 and to remove the epidural scar tissue at L4-5 bilaterally. We then used microdissection to free up the thecal sac and the lateral L4 and L5 nerve root from the epidural tissue. I then used a Kerrison punch to perform a foraminotomy at about the bilateral L4 and L5 nerve root. We inspected the intervertebral disc at L3-4 and L4-5 bilaterally. There was no significant herniated disc.  I then palpated along the ventral surface of the thecal sac and along exit route of the bilateral L4 and L5 nerve root and noted that the neural structures were well decompressed. This completed the decompression.  We then obtained hemostasis using bipolar electrocautery. We irrigated the wound out with bacitracin solution. We then removed the retractor. We then reapproximated the patient's thoracolumbar fascia with interrupted #1 Vicryl suture. We then reapproximated the patient's subcutaneous tissue with interrupted 2-0 Vicryl suture. We then reapproximated patient's skin Dermabond. The drapes were removed. The patient was subsequently returned to the supine position where they were extubated by the anesthesia team. The patient was then transported to the postanesthesia care unit in stable condition. All sponge instrument and needle counts were reportedly correct at the end of this case.

## 2014-05-19 NOTE — Anesthesia Preprocedure Evaluation (Signed)
Anesthesia Evaluation  Patient identified by MRN, date of birth, ID band Patient awake    Reviewed: Allergy & Precautions, H&P , NPO status , Patient's Chart, lab work & pertinent test results  History of Anesthesia Complications (+) PONV and history of anesthetic complications  Airway Mallampati: II TM Distance: >3 FB Neck ROM: Full    Dental  (+) Dental Advisory Given   Pulmonary neg pulmonary ROS,  breath sounds clear to auscultation  Pulmonary exam normal       Cardiovascular negative cardio ROS  Rhythm:Regular Rate:Normal     Neuro/Psych Chronic back pain: neurontin, NSAID    GI/Hepatic negative GI ROS, Neg liver ROS,   Endo/Other  negative endocrine ROS  Renal/GU negative Renal ROS     Musculoskeletal   Abdominal   Peds  Hematology negative hematology ROS (+)   Anesthesia Other Findings   Reproductive/Obstetrics                           Anesthesia Physical Anesthesia Plan  ASA: II  Anesthesia Plan: General   Post-op Pain Management:    Induction: Intravenous  Airway Management Planned: Oral ETT  Additional Equipment:   Intra-op Plan:   Post-operative Plan: Extubation in OR  Informed Consent: I have reviewed the patients History and Physical, chart, labs and discussed the procedure including the risks, benefits and alternatives for the proposed anesthesia with the patient or authorized representative who has indicated his/her understanding and acceptance.   Dental advisory given  Plan Discussed with: CRNA and Surgeon  Anesthesia Plan Comments: (Plan routine monitors, GETA)        Anesthesia Quick Evaluation

## 2014-05-19 NOTE — Transfer of Care (Signed)
Immediate Anesthesia Transfer of Care Note  Patient: Paula Hughes  Procedure(s) Performed: Procedure(s): LUMBAR LAMINECTOMY/DECOMPRESSION MICRODISCECTOMY 2 LEVELS.  Lumbar three/four, four/five (Bilateral)  Patient Location: PACU  Anesthesia Type:General  Level of Consciousness: awake, alert , oriented and patient cooperative  Airway & Oxygen Therapy: Patient Spontanous Breathing and Patient connected to face mask oxygen  Post-op Assessment: Report given to PACU RN, Post -op Vital signs reviewed and stable and Patient moving all extremities X 4  Post vital signs: Reviewed and stable  Complications: No apparent anesthesia complications

## 2014-05-19 NOTE — Anesthesia Postprocedure Evaluation (Signed)
  Anesthesia Post-op Note  Patient: Paula Hughes  Procedure(s) Performed: Procedure(s): LUMBAR LAMINECTOMY/DECOMPRESSION MICRODISCECTOMY 2 LEVELS.  Lumbar three/four, four/five (Bilateral)  Patient Location: PACU  Anesthesia Type:General  Level of Consciousness: awake, alert , oriented and patient cooperative  Airway and Oxygen Therapy: Patient Spontanous Breathing and Patient connected to nasal cannula oxygen  Post-op Pain: none  Post-op Assessment: Post-op Vital signs reviewed, Patient's Cardiovascular Status Stable, Respiratory Function Stable, Patent Airway, No signs of Nausea or vomiting and Pain level controlled  Post-op Vital Signs: Reviewed and stable  Last Vitals:  Filed Vitals:   05/19/14 1315  BP: 100/48  Pulse: 80  Temp: 36.4 C  Resp: 18    Complications: No apparent anesthesia complications

## 2014-05-19 NOTE — Progress Notes (Signed)
Subjective:  The patient is alert and pleasant. She is having some right leg pain.  Objective: Vital signs in last 24 hours: Temp:  [97.6 F (36.4 C)-97.9 F (36.6 C)] 97.6 F (36.4 C) (08/05 1315) Pulse Rate:  [43-80] 80 (08/05 1315) Resp:  [10-21] 18 (08/05 1315) BP: (100-119)/(48-63) 100/48 mmHg (08/05 1315) SpO2:  [93 %-100 %] 93 % (08/05 1315) Weight:  [79.833 kg (176 lb)] 79.833 kg (176 lb) (08/05 0700)  Intake/Output from previous day:   Intake/Output this shift: Total I/O In: 1440 [P.O.:240; I.V.:1200] Out: 100 [Blood:100]  Physical exam patient is alert and oriented. She is moving her lower extremities well.  Lab Results: No results found for this basename: WBC, HGB, HCT, PLT,  in the last 72 hours BMET No results found for this basename: NA, K, CL, CO2, GLUCOSE, BUN, CREATININE, CALCIUM,  in the last 72 hours  Studies/Results: Dg Lumbar Spine 1 View  05/19/2014   CLINICAL DATA:  Lumbar laminectomy  EXAM: LUMBAR SPINE - 1 VIEW  COMPARISON:  10/23/2013  FINDINGS: Surgical retractors in instruments are noted posterior to the L4-5 interspace. The numbering nomenclature similar to that used on prior CT.   Electronically Signed   By: Inez Catalina M.D.   On: 05/19/2014 11:15    Assessment/Plan: Right leg pain: I gave her a dose of Decadron and Neurontin. She will likely go home tomorrow.  LOS: 0 days     Paula Hughes D 05/19/2014, 3:43 PM

## 2014-05-19 NOTE — Plan of Care (Signed)
Problem: Consults Goal: Diagnosis - Spinal Surgery Outcome: Completed/Met Date Met:  05/19/14 Lumbar Laminectomy (Complex)

## 2014-05-19 NOTE — Progress Notes (Signed)
Subjective:  The patient is somnolent but easily arousable. She is in no apparent distress.  Objective: Vital signs in last 24 hours: Temp:  [97.7 F (36.5 C)-97.9 F (36.6 C)] 97.9 F (36.6 C) (08/05 1125) Pulse Rate:  [43-60] 43 (08/05 1138) Resp:  [17-20] 17 (08/05 1138) BP: (114-119)/(54-56) 114/56 mmHg (08/05 1125) SpO2:  [95 %-100 %] 95 % (08/05 1138) Weight:  [79.833 kg (176 lb)] 79.833 kg (176 lb) (08/05 0700)  Intake/Output from previous day:   Intake/Output this shift: Total I/O In: 1200 [I.V.:1200] Out: 100 [Blood:100]  Physical exam patient is somewhat but arousable. She is moving all 4 extremities well.  Lab Results: No results found for this basename: WBC, HGB, HCT, PLT,  in the last 72 hours BMET No results found for this basename: NA, K, CL, CO2, GLUCOSE, BUN, CREATININE, CALCIUM,  in the last 72 hours  Studies/Results: Dg Lumbar Spine 1 View  05/19/2014   CLINICAL DATA:  Lumbar laminectomy  EXAM: LUMBAR SPINE - 1 VIEW  COMPARISON:  10/23/2013  FINDINGS: Surgical retractors in instruments are noted posterior to the L4-5 interspace. The numbering nomenclature similar to that used on prior CT.   Electronically Signed   By: Inez Catalina M.D.   On: 05/19/2014 11:15    Assessment/Plan: The patient is doing well.  LOS: 0 days     Lindell Renfrew D 05/19/2014, 11:45 AM
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
## 2014-05-20 ENCOUNTER — Encounter (HOSPITAL_COMMUNITY): Payer: Self-pay | Admitting: Neurosurgery

## 2014-05-20 DIAGNOSIS — M48062 Spinal stenosis, lumbar region with neurogenic claudication: Secondary | ICD-10-CM | POA: Diagnosis not present

## 2014-05-20 MED ORDER — DSS 100 MG PO CAPS: 100.0000 mg | ORAL_CAPSULE | Freq: Two times a day (BID) | ORAL | Status: DC

## 2014-05-20 MED ORDER — OXYCODONE-ACETAMINOPHEN 10-325 MG PO TABS: 1.0000 | ORAL_TABLET | ORAL | Status: DC | PRN

## 2014-05-20 MED ORDER — DIAZEPAM 5 MG PO TABS: 5.0000 mg | ORAL_TABLET | Freq: Four times a day (QID) | ORAL | Status: DC | PRN

## 2014-05-20 NOTE — Progress Notes (Signed)
PT Cancellation Note  Patient Details Name: Paula Hughes MRN: [id number] DOB: 08/20/1956   Cancelled Treatment:    Reason Eval/Treat Not Completed: PT screened, no needs identified, will sign off.   Rodderick Holtzer 05/20/2014, 9:38 AM

## 2014-05-20 NOTE — Progress Notes (Signed)
Patient alert and oriented, mae's well, voiding adequate amount of urine, swallowing without difficulty, c/o minimal pain. Patient discharged home with family. Script and discharged instructions given to patient. Patient and family stated understanding of instructions given.

## 2014-05-20 NOTE — Discharge Summary (Signed)
Physician Discharge Summary  Patient ID: Paula Hughes MRN: [id number] DOB/AGE: 04-25-1956 58 y.o.  Admit date: 05/19/2014 Discharge date: 05/20/2014  Admission Diagnoses: L3-4 and L4-5 spinal stenosis, synovial cyst, lumbago, lumbar radiculopathy  Discharge Diagnoses: The same Active Problems:   Lumbar stenosis with neurogenic claudication   Discharged Condition: good  Hospital Course: I performed a bilateral L3-4 and L4-5 laminotomy and foraminotomy with removal of synovial cyst at L4-5 on at 5:15. The surgery went well.  The patient's postoperative course was unremarkable. On postop day #1 she requested discharge to home. She was given oral and written discharge instructions. All her questions were answered.  Consults: None Significant Diagnostic Studies: None Treatments: Bilateral L3-4 and L4-5 laminotomies and foraminotomies using microdissection Discharge Exam: Blood pressure 108/63, pulse 58, temperature 98 F (36.7 C), temperature source Oral, resp. rate 16, weight 79.833 kg (176 lb), SpO2 94.00%. The patient is alert and pleasant. Her strength is normal in her lower extremities.  Disposition: Home  Discharge Instructions   Call MD for:  difficulty breathing, headache or visual disturbances    Complete by:  As directed      Call MD for:  extreme fatigue    Complete by:  As directed      Call MD for:  hives    Complete by:  As directed      Call MD for:  persistant dizziness or light-headedness    Complete by:  As directed      Call MD for:  persistant nausea and vomiting    Complete by:  As directed      Call MD for:  redness, tenderness, or signs of infection (pain, swelling, redness, odor or green/yellow discharge around incision site)    Complete by:  As directed      Call MD for:  severe uncontrolled pain    Complete by:  As directed      Call MD for:  temperature >100.4    Complete by:  As directed      Diet - low sodium heart healthy    Complete by:  As  directed      Discharge instructions    Complete by:  As directed   Call [phone] for a followup appointment. Take a stool softener while you are using pain medications.     Driving Restrictions    Complete by:  As directed   Do not drive for 2 weeks.     Increase activity slowly    Complete by:  As directed      Lifting restrictions    Complete by:  As directed   Do not lift more than 5 pounds. No excessive bending or twisting.     May shower / Bathe    Complete by:  As directed   He may shower after the pain she is removed 3 days after surgery. Leave the incision alone.     Remove dressing in 48 hours    Complete by:  As directed   Your stitches are under the scan and will dissolve by themselves. The Steri-Strips will fall off after you take a few showers. Do not rub back or pick at the wound, Leave the wound alone.            Medication List    STOP taking these medications       ADVIL 200 MG tablet  Generic drug:  ibuprofen     multivitamin with minerals Tabs tablet      TAKE  these medications       Astaxanthin 4 MG Caps  Take 1 capsule by mouth daily.     CALCIUM SOFT CHEWS PO  Take 1 tablet by mouth daily. 1000 mg calcium, 2000 IU  D3, 80 mcg K     CHONDROITIN SULFATE PO  Take 600 mg by mouth daily.     Chromium 200 MCG Caps  Take 200 mcg by mouth daily.     Co Q-10 50 MG Caps  Take 50 mg by mouth daily.     diazepam 5 MG tablet  Commonly known as:  VALIUM  Take 1 tablet (5 mg total) by mouth every 6 (six) hours as needed for muscle spasms.     diphenhydrAMINE 25 mg capsule  Commonly known as:  BENADRYL  Take 25 mg by mouth at bedtime as needed.     DSS 100 MG Caps  Take 100 mg by mouth 2 (two) times daily.     EPA PLUS PO  Take 1,100 mg by mouth daily.     estradiol 1 MG tablet  Commonly known as:  ESTRACE  Take 1 mg by mouth daily.     gabapentin 100 MG capsule  Commonly known as:  NEURONTIN  Take 300 mg by mouth 2 (two) times daily.      GARCINIA CAMBOGIA-CHROMIUM PO  Take 1,000 mg by mouth daily.     Glucosamine HCl 750 MG Tabs  Take 750 mg by mouth daily.     MAGNESIUM GLYCINATE PLUS PO  Take 400 mg by mouth daily.     METAMUCIL PO  Take 5 capsules by mouth daily.     MSM 500 MG Caps  Take 500 mg by mouth daily.     oxyCODONE-acetaminophen 10-325 MG per tablet  Commonly known as:  PERCOCET  Take 1 tablet by mouth every 4 (four) hours as needed for pain.     solifenacin 10 MG tablet  Commonly known as:  VESICARE  Take 10 mg by mouth daily.         SignedOphelia Charter 05/20/2014, 7:53 AM

## 2014-06-24 ENCOUNTER — Encounter: Payer: Self-pay | Admitting: Family Medicine

## 2014-06-24 ENCOUNTER — Ambulatory Visit (INDEPENDENT_AMBULATORY_CARE_PROVIDER_SITE_OTHER): Payer: 59 | Admitting: Family Medicine

## 2014-06-24 VITALS — BP 122/82 | HR 68 | Temp 97.5°F | Ht 70.0 in | Wt 174.0 lb

## 2014-06-24 DIAGNOSIS — J069 Acute upper respiratory infection, unspecified: Secondary | ICD-10-CM
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
## 2014-06-24 NOTE — Patient Instructions (Signed)
  Drink plenty of fluids. Continue sudafed as needed for runny nose and sinus pressure. Start Mucinex (guaifenesin--expectorant to keep the mucus thin). If cough gets worse, you can add dextromethorphan (delsym syrup, or change to a combination of guaifenesin and dextromethorphan)  Consider using claritin, allegra OR zyrtec (no D versions) if you are having ongoing runny nose and sneezing.  Your mucus membranes were pale, and while a virus is most likely what you have, I can't rule out underlying allergies as well.  Call or return if symptoms persist or worsen.

## 2014-09-22 ENCOUNTER — Other Ambulatory Visit: Payer: Self-pay | Admitting: Internal Medicine

## 2014-09-22 DIAGNOSIS — E042 Nontoxic multinodular goiter: Secondary | ICD-10-CM

## 2014-09-24 ENCOUNTER — Ambulatory Visit
Admission: RE | Admit: 2014-09-24 | Discharge: 2014-09-24 | Disposition: A | Discharge status: Home / Self Care | Payer: 59 | Source: Ambulatory Visit | Attending: Internal Medicine | Admitting: Internal Medicine

## 2014-09-24 DIAGNOSIS — E042 Nontoxic multinodular goiter: Secondary | ICD-10-CM

## 2014-10-20 ENCOUNTER — Ambulatory Visit (INDEPENDENT_AMBULATORY_CARE_PROVIDER_SITE_OTHER): Payer: 59 | Admitting: Family Medicine

## 2014-10-20 ENCOUNTER — Encounter: Payer: Self-pay | Admitting: Family Medicine

## 2014-10-20 ENCOUNTER — Telehealth: Payer: Self-pay | Admitting: Family Medicine

## 2014-10-20 VITALS — BP 112/68 | HR 60 | Temp 96.6°F | Ht 70.0 in | Wt 174.0 lb

## 2014-10-20 DIAGNOSIS — R05 Cough: Secondary | ICD-10-CM

## 2014-10-20 DIAGNOSIS — R059 Cough, unspecified: Secondary | ICD-10-CM

## 2014-10-20 DIAGNOSIS — J209 Acute bronchitis, unspecified: Secondary | ICD-10-CM

## 2014-10-20 MED ORDER — BENZONATATE 200 MG PO CAPS: 200.0000 mg | ORAL_CAPSULE | Freq: Three times a day (TID) | ORAL | Status: DC | PRN

## 2014-10-20 MED ORDER — SOLIFENACIN SUCCINATE 10 MG PO TABS: 10.0000 mg | ORAL_TABLET | Freq: Every day | ORAL | Status: DC

## 2014-10-20 MED ORDER — AZITHROMYCIN 250 MG PO TABS: ORAL_TABLET | ORAL | Status: DC

## 2014-10-20 NOTE — Patient Instructions (Signed)
  Drink plenty of fluids. Continue Lodine for pain.  You may use acetaminophen (Tylenol) as needed for pain or fever. Use the tessalon as needed for cough. Take the z-pak as prescribed. Return if persistent fevers, worsening cough or shortness of breath.  Stop the excessive Vitamin D.  Up to 2000 IU daily is plenty (and you are also getting from other sources)
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
## 2014-10-20 NOTE — Telephone Encounter (Signed)
rx sent

## 2014-10-20 NOTE — Telephone Encounter (Signed)
Ok to send this rx in for 2 weeks

## 2014-10-20 NOTE — Telephone Encounter (Signed)
[facility] called and stated that pt informed them that she was to pick up a hold over supply of Vesicare. She told them that she discussed it with you at today's appt. Apparently her mail order supply is delayed. Pt told pharmacy she only needed like a 2 week supply.  Please send over rx for  [facility]icare to [facility] on [address].

## 2014-10-21 ENCOUNTER — Ambulatory Visit: Payer: Self-pay | Admitting: Family Medicine

## 2014-10-28 ENCOUNTER — Other Ambulatory Visit: Payer: Self-pay | Admitting: Family Medicine

## 2014-10-28 NOTE — Telephone Encounter (Signed)
Make sure that she is still using mucinex (guaifenesin) regularly to help with the congestion.  The tessalon only suppresses the cough. Okay to refill #20 only, and she needs to f/u next week if not better.

## 2014-10-28 NOTE — Telephone Encounter (Signed)
Called patient and she has 4 tessalon caps left. She is going out of town Sat,Sun and Monday and is still coughing. Not all the time. Comes in spurts and has little coughing fits. Still has some chest congestion in the back of her chest. Is this okay to refill?

## 2014-11-27 IMAGING — CR DG MYELOGRAM LUMBAR
13 of 18 series · 13 of 18 positions shown · IV contrast (omnipaque)
Comparison: MR 05/07/2013

FLUOROSCOPY TIME:  38 seconds

CLINICAL DATA: Lumbago, right lower extremity pain. Previous lumbar
surgery.

EXAM:
LUMBAR MYELOGRAM
CT LUMBAR SPINE WITH INTRATHECAL CONTRAST
TECHNIQUE: The procedure, risks (including but not limited to bleeding,
infection, organ damage ), benefits, and alternatives were explained
to the patient. Questions regarding the procedure were encouraged
and answered. The patient understands and consents to the procedure.
An appropriate entry site was determined under fluoroscopy. Operator
donned sterile gloves and mask. Skin site was marked, prepped with
Betadine, and draped in usual sterile fashion, and infiltrated
locally with 1% lidocaine. A 22 gauge spinal needle was advanced
into the thecal sac at L5 from a right parasagittal approach. Clear
colorless CSF returned. 17 ml Omnipaque 180 were administered
intrathecally for lumbar myelography, followed by axial CT scanning
of the lumbar spine. I personally performed the lumbar puncture and
administered the intrathecal contrast. I also personally supervised
acquisition of the myelogram images. Coronal and sagittal
reconstructions were generated from the axial scan.

[[hospital]]
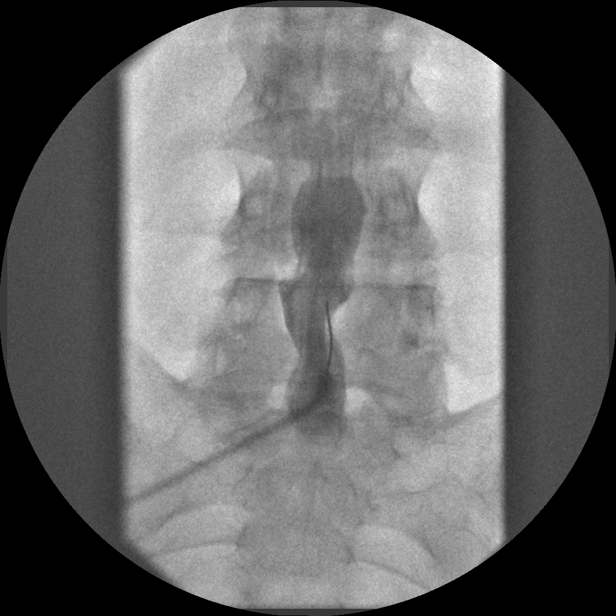

[myelogram  white (1 of 10)]
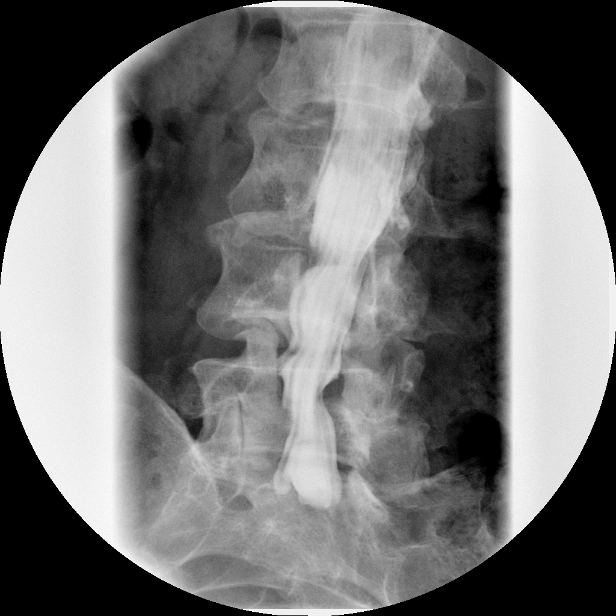

[myelogram  white (2 of 10)]
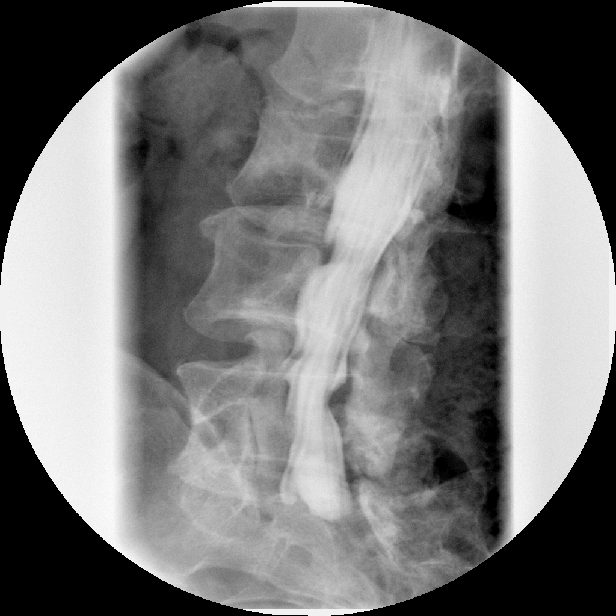

[myelogram  white (3 of 10)]
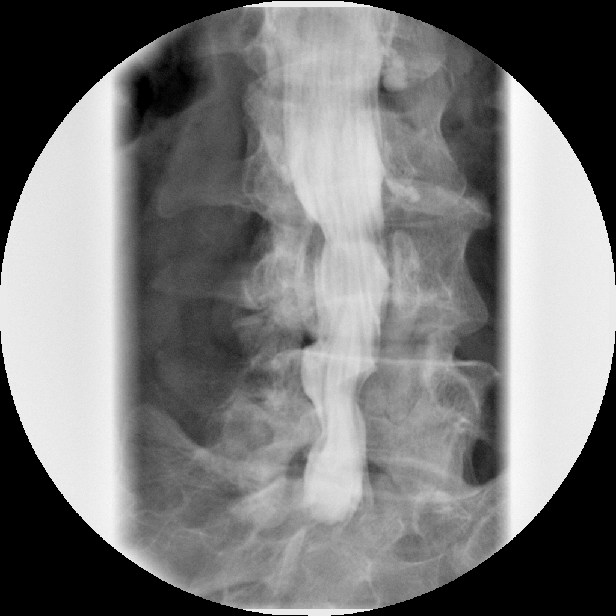

[myelogram  white (4 of 10)]
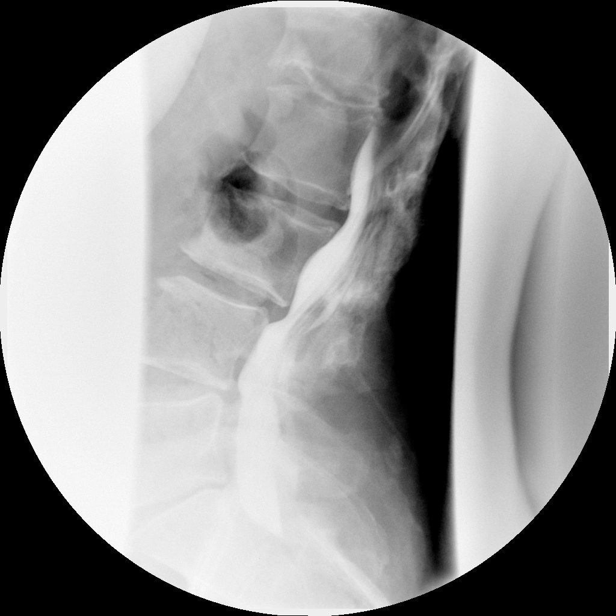

[myelogram  white (5 of 10)]
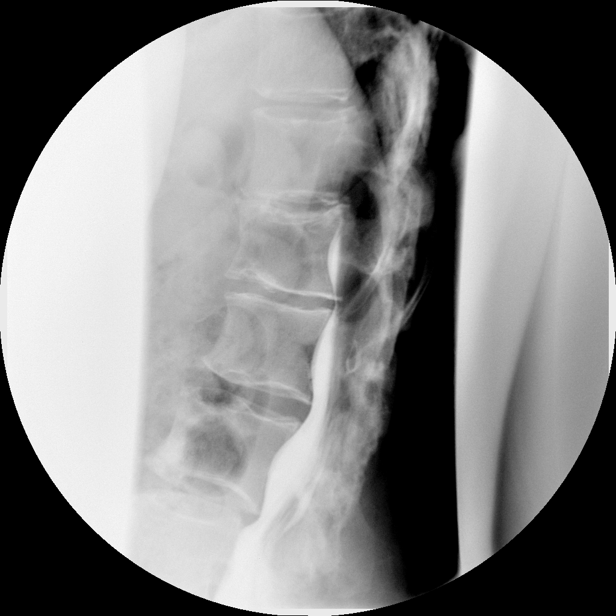

[myelogram  white (6 of 10)]
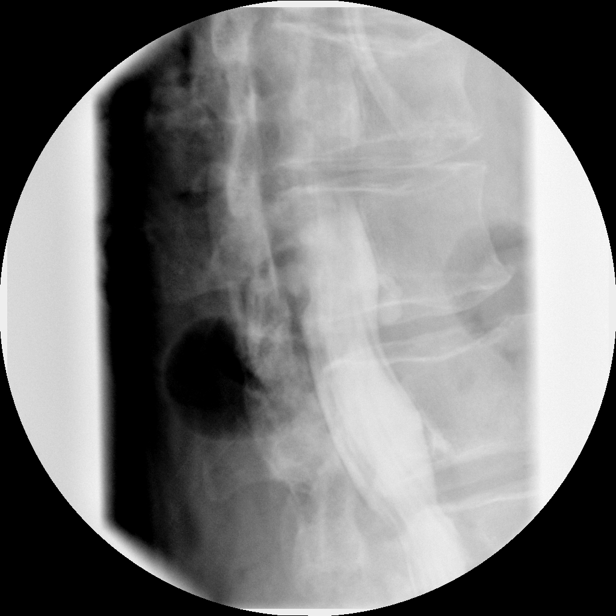

[myelogram  white (7 of 10)]
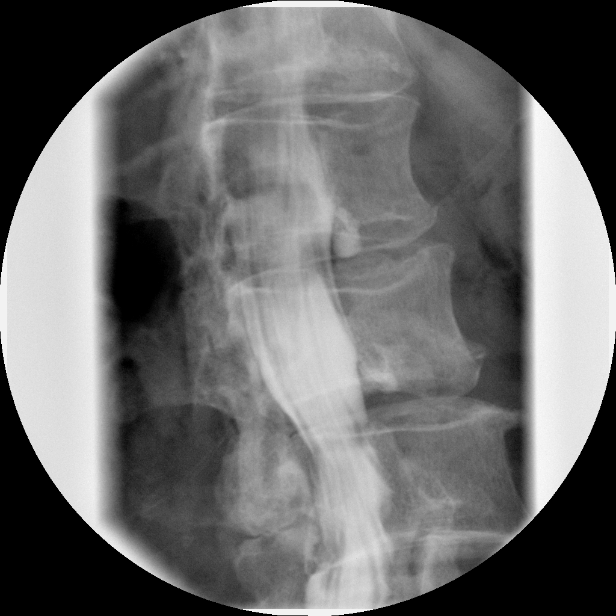

[myelogram  white (8 of 10)]
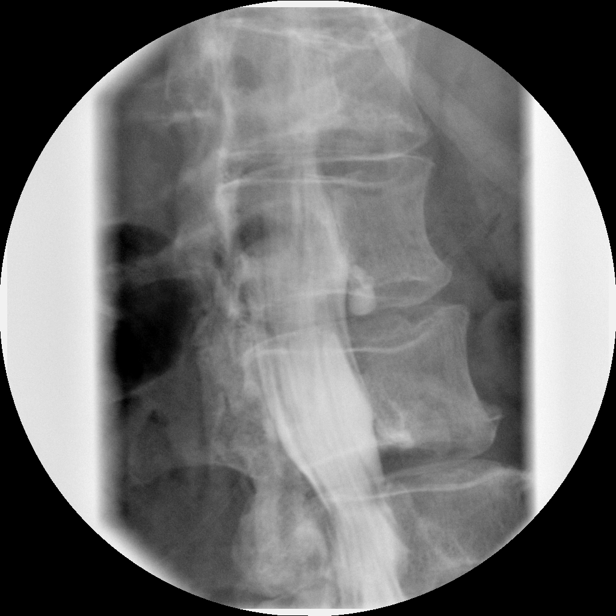

[myelogram  white (9 of 10)]
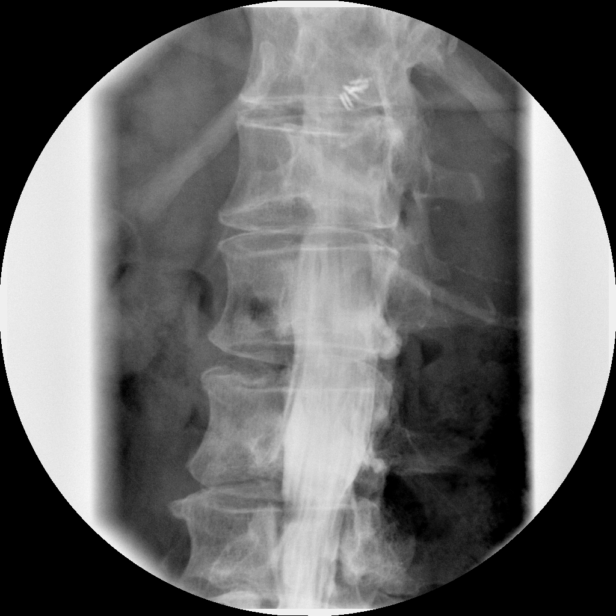

[myelogram  white (10 of 10)]
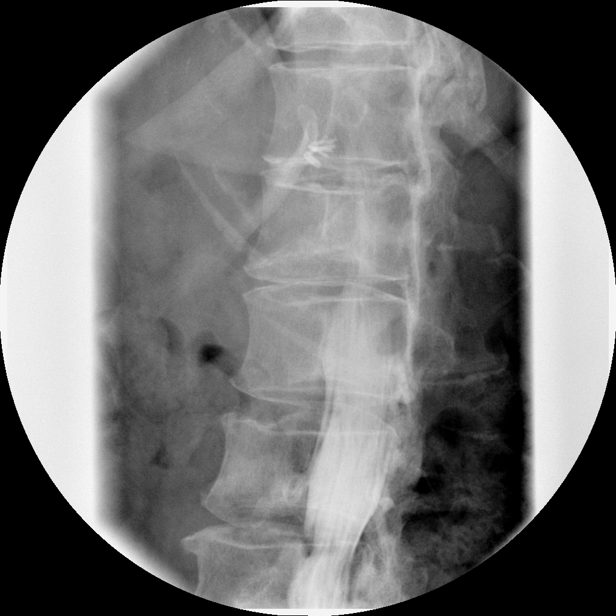

[view not recorded (1 of 2)]
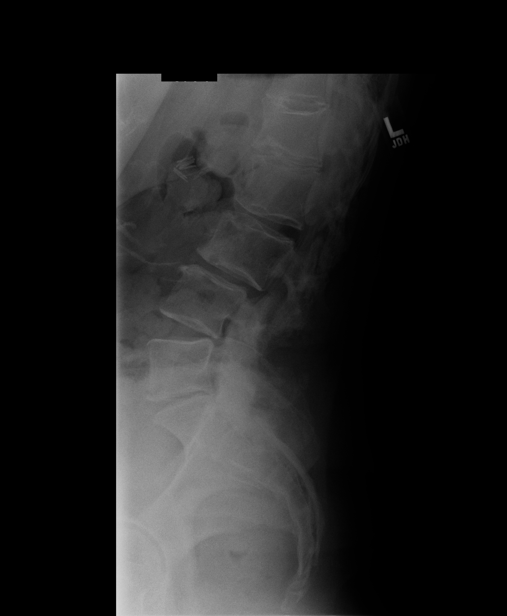

[view not recorded (2 of 2)]
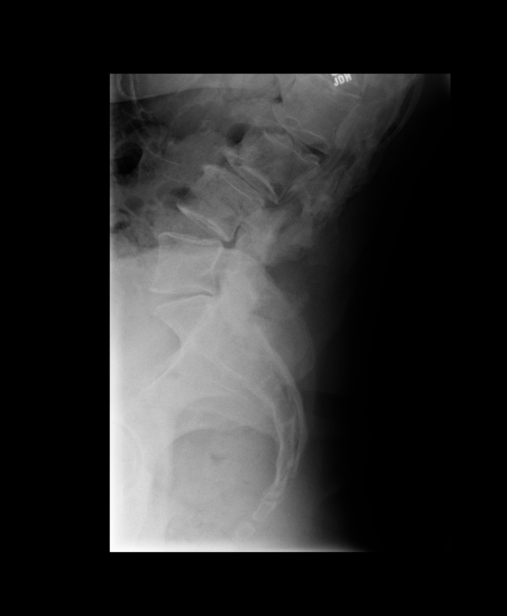

[13 of 18 positions shown; findings below may reference images not displayed]

FINDINGS: Numbering scheme follows that used on prior exam.

T12-L1: Fusion across the anterior aspect of the interspace and
posterior elements. Central canal and foramina unremarkable. Conus
terminates behind L1.

L1-2: Fusion across posterior elements. Central canal and foramina
patent. No disc bulge or protrusion.

L2-3: Early vacuum phenomenon in the interspace. Small Schmorl's
nodes in adjacent endplates. Mild disc bulge. Probable fusion across
posterior elements in the midline. There is some thickening of the
ligamentum flavum contributing to mild narrowing of the central
canal in the AP dimension and early encroachment upon the
subarticular recesses. Foramina remain patent.

L3-4: There is retrolisthesis of L3 on L4 measuring approximately 8
mm in standing neutral and extension, reduced to 6 mm on flexion.
Prominent Schmorl's node in the inferior endplate of L3. Small
anterior endplate spurs. Mild circumferential disc bulge. There is
mild facet degenerative hypertrophy contribute only mild central
canal stenosis and foraminal encroachment.

L4-5: Previous left laminotomy and partial resection of the left
inferior articular facet of L4. Degenerative sclerosis and
hypertrophy in the right and residual left facet joints. Moderate
circumferential disc bulge. There is contiguous soft tissue
attenuation material extending caudal behind the L5 vertebral body
on the right, with right subarticular recess stenosis, cut off of
the right L5 nerve root sleeve and medial displacement of the S1
nerve root.

L5-S1: Narrowing of the interspace with small Schmorl's nodes in the
adjacent endplates, minimal disc bulge. Bilateral facet degenerative
hypertrophy and spurring. Central canal patent. Early bilateral
foraminal encroachment.

There is patchy aortoiliac arterial plaque. Remainder of visualized
paraspinal soft tissues unremarkable.
IMPRESSION: 1. Solid-appearing posterior fusion T12 to L3.
2. Retrolisthesis of L3 on L4 with a dynamic component, mild
multifactorial spinal stenosis and foraminal encroachment.
3. Right disc extrusion versus recurrent synovial cyst L4-5
extending caudal behind the L5 vertebral body, with myelographic cut
off of the right L5 nerve root sleeve and medial displacement of S1.
4. Disc bulge and mild facet disease L5-S1 without compressive
pathology.

## 2014-11-27 IMAGING — CT CT L SPINE W/ CM
3 of 9 series · 6 of 20 positions shown, 7 images · IV contrast (omnipaque)
Comparison: MR 05/07/2013

FLUOROSCOPY TIME:  38 seconds

CLINICAL DATA: Lumbago, right lower extremity pain. Previous lumbar
surgery.

EXAM:
LUMBAR MYELOGRAM
CT LUMBAR SPINE WITH INTRATHECAL CONTRAST
TECHNIQUE: The procedure, risks (including but not limited to bleeding,
infection, organ damage ), benefits, and alternatives were explained
to the patient. Questions regarding the procedure were encouraged
and answered. The patient understands and consents to the procedure.
An appropriate entry site was determined under fluoroscopy. Operator
donned sterile gloves and mask. Skin site was marked, prepped with
Betadine, and draped in usual sterile fashion, and infiltrated
locally with 1% lidocaine. A 22 gauge spinal needle was advanced
into the thecal sac at L5 from a right parasagittal approach. Clear
colorless CSF returned. 17 ml Omnipaque 180 were administered
intrathecally for lumbar myelography, followed by axial CT scanning
of the lumbar spine. I personally performed the lumbar puncture and
administered the intrathecal contrast. I also personally supervised
acquisition of the myelogram images. Coronal and sagittal
reconstructions were generated from the axial scan.

[Series 2: l spine bone · axial · 0.27mm/px · z∈[-27,+43]mm · 2 of 85 slices shown, 3 images]
[im 29/85  soft-tissue]
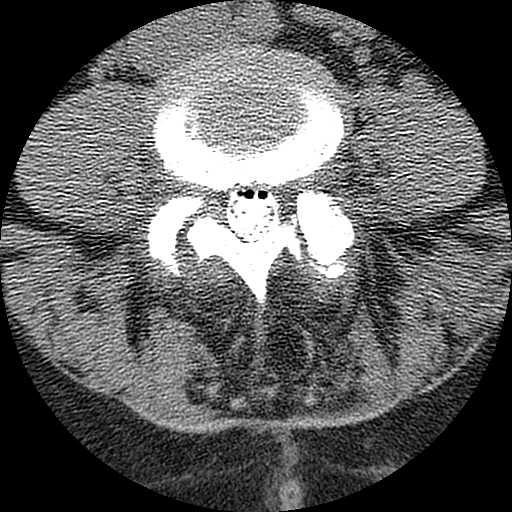
[im 29/85  bone]
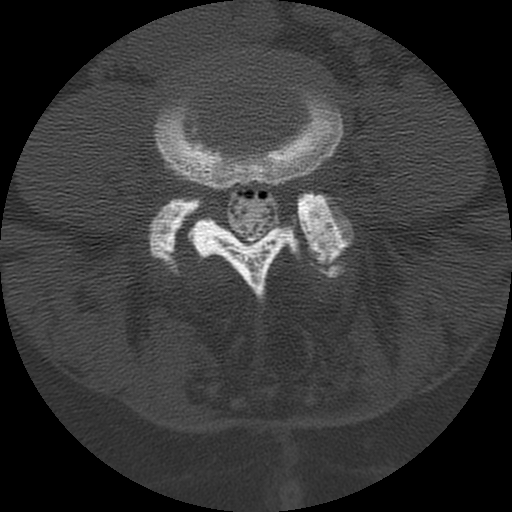
[im 57/85  bone]
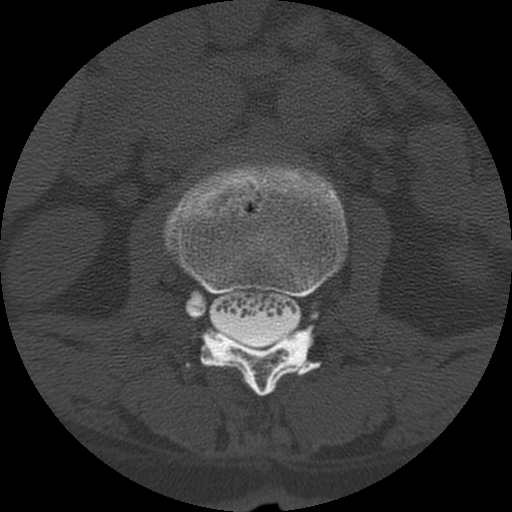

[Series 3: l spine soft · axial · 0.27mm/px · z∈[-44,+61]mm · 3 of 85 slices shown]
[im 22/85  soft-tissue]
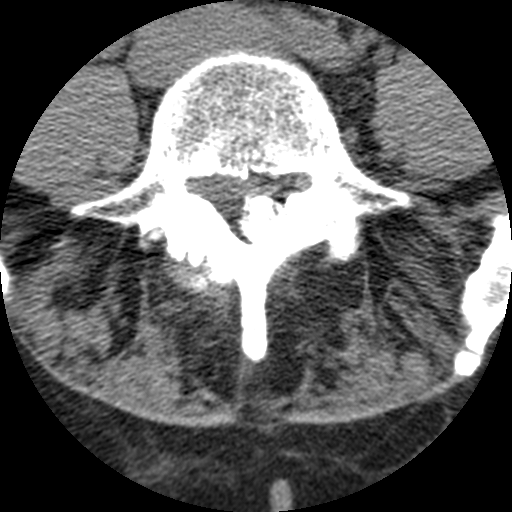
[im 43/85  soft-tissue]
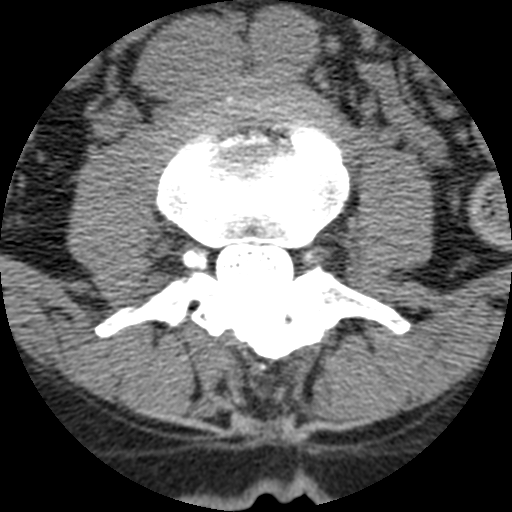
[im 64/85  soft-tissue]
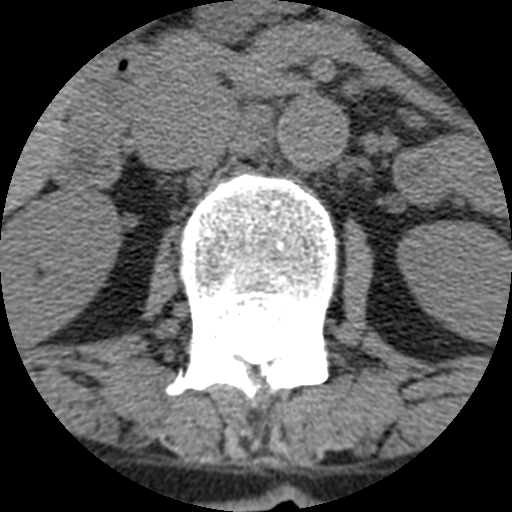

[Series 400: coronal · coronal · 0.42mm/px · 1 of 50 slices shown]
[im 25/50  bone]
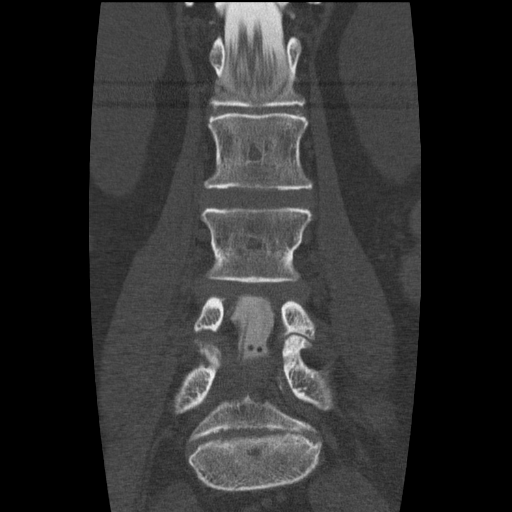

[6 of 20 positions shown; findings below may reference images not displayed]

FINDINGS: Numbering scheme follows that used on prior exam.

T12-L1: Fusion across the anterior aspect of the interspace and
posterior elements. Central canal and foramina unremarkable. Conus
terminates behind L1.

L1-2: Fusion across posterior elements. Central canal and foramina
patent. No disc bulge or protrusion.

L2-3: Early vacuum phenomenon in the interspace. Small Schmorl's
nodes in adjacent endplates. Mild disc bulge. Probable fusion across
posterior elements in the midline. There is some thickening of the
ligamentum flavum contributing to mild narrowing of the central
canal in the AP dimension and early encroachment upon the
subarticular recesses. Foramina remain patent.

L3-4: There is retrolisthesis of L3 on L4 measuring approximately 8
mm in standing neutral and extension, reduced to 6 mm on flexion.
Prominent Schmorl's node in the inferior endplate of L3. Small
anterior endplate spurs. Mild circumferential disc bulge. There is
mild facet degenerative hypertrophy contribute only mild central
canal stenosis and foraminal encroachment.

L4-5: Previous left laminotomy and partial resection of the left
inferior articular facet of L4. Degenerative sclerosis and
hypertrophy in the right and residual left facet joints. Moderate
circumferential disc bulge. There is contiguous soft tissue
attenuation material extending caudal behind the L5 vertebral body
on the right, with right subarticular recess stenosis, cut off of
the right L5 nerve root sleeve and medial displacement of the S1
nerve root.

L5-S1: Narrowing of the interspace with small Schmorl's nodes in the
adjacent endplates, minimal disc bulge. Bilateral facet degenerative
hypertrophy and spurring. Central canal patent. Early bilateral
foraminal encroachment.

There is patchy aortoiliac arterial plaque. Remainder of visualized
paraspinal soft tissues unremarkable.
IMPRESSION: 1. Solid-appearing posterior fusion T12 to L3.
2. Retrolisthesis of L3 on L4 with a dynamic component, mild
multifactorial spinal stenosis and foraminal encroachment.
3. Right disc extrusion versus recurrent synovial cyst L4-5
extending caudal behind the L5 vertebral body, with myelographic cut
off of the right L5 nerve root sleeve and medial displacement of S1.
4. Disc bulge and mild facet disease L5-S1 without compressive
pathology.

## 2015-01-13 ENCOUNTER — Encounter: Payer: Self-pay | Admitting: Family Medicine

## 2015-01-13 ENCOUNTER — Ambulatory Visit (INDEPENDENT_AMBULATORY_CARE_PROVIDER_SITE_OTHER): Payer: 59 | Admitting: Family Medicine

## 2015-01-13 VITALS — BP 118/64 | HR 64 | Temp 97.0°F | Ht 70.0 in | Wt 177.8 lb

## 2015-01-13 DIAGNOSIS — M25559 Pain in unspecified hip: Secondary | ICD-10-CM

## 2015-01-13 NOTE — Patient Instructions (Signed)
You do NOT have trochanteric bursitis. Your pain is muscular in your deep buttock muscles.  Do the stretches as shown, as well as massage (foam roller).  Consider Active Release Technique (ART) by a licensed chiropractor if not improving. ([facility] does that)  If your burning pain is shingles, it is on the mend, and no treatment is needed.

## 2015-01-13 NOTE — Progress Notes (Signed)
Chief Complaint  Patient presents with  . Pain    left sided pain from back to front-burning, feels the same as when had shingles on th left side of her face. Also has a "itch" ont he right side of her face x about 4 months-unrelated but did want to mention.   . Hip Pain    has been having right hip pain since her back surgery-was to see Dr.Ramos for injection for hip bursitis. Apparently-Dr.Ramos does not do pain management any longer. Was wondering if you could so do shot?   Burning pain at her left hip over the last 2 weeks.  It starts at the back, left sacral area, and extends around to the front of the left hip.  It isn't constant, comes and goes.  She notices it when she touches it (ie pulling up underwear, pants rubbing on it, certain positions laying in bed), but otherwise doesn't really notice it (ie sitting at desk working). She has noticed two small areas on her skin--one on the left side of her spine, and a very small area at her anterior left hip.  They were itchy at first, but not now.  She had h/o shingles on her face that was very mild.  She is also having right side pain in her hip.  Dr. Arnoldo Morale said it wasn't from her back, that it was from bursitis. He sent her back to Dr. Nelva Bush, since she has seen him before. She heard from Dr. Arnoldo Morale' office yesterday that R. Ramos no longer did pain management.  PMH, PSH, SH reviewed  Outpatient Encounter Prescriptions as of 01/13/2015  Medication Sig Note  . Astaxanthin 4 MG CAPS Take 1 capsule by mouth daily.   . Calcium-Vitamin D-Vitamin K (CALCIUM SOFT CHEWS PO) Take 1 tablet by mouth daily. 1000 mg calcium, 2000 IU  D3, 80 mcg K   . CHONDROITIN SULFATE PO Take 600 mg by mouth daily.   . Chromium 200 MCG CAPS Take 200 mcg by mouth daily.   . Coenzyme Q10 (CO Q-10) 50 MG CAPS Take 50 mg by mouth daily.   . diphenhydrAMINE (BENADRYL) 25 mg capsule Take 25 mg by mouth at bedtime as needed.   Marland Kitchen estradiol (ESTRACE) 0.5 MG tablet Take 0.5  mg by mouth daily.   Marland Kitchen etodolac (LODINE) 400 MG tablet Take 400 mg by mouth 2 (two) times daily.  10/20/2014: Takes twice daily--from Dr. Arnoldo Morale for her back  . fexofenadine (ALLEGRA) 180 MG tablet Take 180 mg by mouth daily.   Marland Kitchen GARCINIA CAMBOGIA-CHROMIUM PO Take 1,000 mg by mouth daily.   . Glucosamine HCl 750 MG TABS Take 750 mg by mouth daily.   Marland Kitchen MAGNESIUM GLYCINATE PLUS PO Take 400 mg by mouth daily.   . Methylsulfonylmethane (MSM) 500 MG CAPS Take 500 mg by mouth daily.   . Omega-3 Fatty Acids (EPA PLUS PO) Take 1,100 mg by mouth daily.    . Psyllium (METAMUCIL PO) Take 5 capsules by mouth daily.   . solifenacin (VESICARE) 10 MG tablet Take 1 tablet (10 mg total) by mouth daily.   . [DISCONTINUED] estradiol (ESTRACE) 1 MG tablet Take 1 mg by mouth daily.     . [DISCONTINUED] azithromycin (ZITHROMAX) 250 MG tablet Take 2 tablets by mouth on first day, then 1 tablet by mouth on days 2 through 5   . [DISCONTINUED] benzonatate (TESSALON) 200 MG capsule TAKE ONE CAPSULE 3 TIMES A DAY    Allergies  Allergen Reactions  .  Adhesive [Tape]     Steri Strips cause rash, regular tape is okay  . Aleve [Naproxen Sodium] Other (See Comments)    shakes  . Doxycycline Calcium Nausea Only  . Sulfa Antibiotics Rash   ROS:  No fevers, chills, URI symptoms, nausea, vomiting, diarrhea, bleeding, bruising, urinary complaints. Slight itching on her right face (without rash). No chest pain, shortness of breath or other complaints, other than as noted in HPI  PHYSICAL EXAM: BP 118/64 mmHg  Pulse 64  Temp(Src) 97 F (36.1 C) (Tympanic)  Ht 5\' 10"  (1.778 m)  Wt 177 lb 12.8 oz (80.65 kg)  BMI 25.51 kg/m2  Well developed, pleasant female in no distress Skin: Small papule noted next to scar at L spine (L1-2 area). No erythema, no scab or vesicle.  Very faint lesion anteriorly on L abdomen/hip (not vesicular, no erythema).  Right trochanteric bursa is nontender. Tender at sciatic notch and along deep  buttock muscles just inferior at lateral to this. Normal strength, sensation.  Some discomfort with pyriformis stretch.  ASSESSMENT/PLAN:  Hip pain, unspecified laterality - burning on the left, possible resolving shingles; muscular pain on the right  Possible shingles in L1 distribution (based mainly on the location of the two skin lesions that remain).  Doubtful based on mild pain and history. At this point, if shingles, no treatment is needed.  Muscle spasm in deep buttock muscles. No evidence of bursitis. Stretches shown.  Heat, massage, continue NSAID Consider active release technique or PT if not resolving.

## 2015-03-30 ENCOUNTER — Other Ambulatory Visit: Payer: Self-pay | Admitting: Family Medicine

## 2015-03-30 NOTE — Telephone Encounter (Signed)
She has no upcoming appointments.  Needs to schedule CPE; can refill until then (doesn't need med check just for this, if doing well)

## 2015-03-30 NOTE — Telephone Encounter (Signed)
Is this okay to refill? Looks like she was due for CPE this past May. Can she schedule for next avail or needs meed check sooner?

## 2015-03-30 NOTE — Telephone Encounter (Signed)
Left message for patient and refilled for 90 days.

## 2015-06-09 ENCOUNTER — Encounter: Payer: Self-pay | Admitting: Family Medicine

## 2015-06-09 ENCOUNTER — Ambulatory Visit (INDEPENDENT_AMBULATORY_CARE_PROVIDER_SITE_OTHER): Payer: 59 | Admitting: Family Medicine

## 2015-06-09 VITALS — BP 102/64 | HR 68 | Ht 70.5 in | Wt 179.8 lb

## 2015-06-09 DIAGNOSIS — N3281 Overactive bladder: Secondary | ICD-10-CM | POA: Diagnosis not present

## 2015-06-09 DIAGNOSIS — Z0000 Encounter for general adult medical examination without abnormal findings: Secondary | ICD-10-CM | POA: Diagnosis not present

## 2015-06-09 DIAGNOSIS — M179 Osteoarthritis of knee, unspecified: Secondary | ICD-10-CM | POA: Insufficient documentation

## 2015-06-09 DIAGNOSIS — M170 Bilateral primary osteoarthritis of knee: Secondary | ICD-10-CM

## 2015-06-09 DIAGNOSIS — Z5181 Encounter for therapeutic drug level monitoring: Secondary | ICD-10-CM

## 2015-06-09 DIAGNOSIS — M1710 Unilateral primary osteoarthritis, unspecified knee: Secondary | ICD-10-CM | POA: Insufficient documentation

## 2015-06-09 LAB — POCT URINALYSIS DIPSTICK
Blood, UA: NEGATIVE
Glucose, UA: NEGATIVE
Ketones, UA: NEGATIVE
Leukocytes, UA: NEGATIVE
NITRITE UA: NEGATIVE
PH UA: 5.5
PROTEIN UA: NEGATIVE
Spec Grav, UA: >=1.03
Urobilinogen, UA: NEGATIVE

## 2015-06-09 LAB — COMPREHENSIVE METABOLIC PANEL
ALK PHOS: 53 U/L (ref 33–130)
ALT: 18 U/L (ref 6–29)
AST: 24 U/L (ref 10–35)
Albumin: 4.4 g/dL (ref 3.6–5.1)
BILIRUBIN TOTAL: 0.8 mg/dL (ref 0.2–1.2)
BUN: 26 mg/dL — AB (ref 7–25)
CO2: 24 mmol/L (ref 20–31)
CREATININE: 0.74 mg/dL (ref 0.50–1.05)
Calcium: 9.8 mg/dL (ref 8.6–10.4)
Chloride: 104 mmol/L (ref 98–110)
Glucose, Bld: 81 mg/dL (ref 65–99)
Potassium: 4.1 mmol/L (ref 3.5–5.3)
SODIUM: 138 mmol/L (ref 135–146)
Total Protein: 6.4 g/dL (ref 6.1–8.1)

## 2015-06-09 LAB — CBC WITH DIFFERENTIAL/PLATELET
BASOS ABS: 0 10*3/uL (ref 0.0–0.1)
Basophils Relative: 1 % (ref 0–1)
EOS ABS: 0.1 10*3/uL (ref 0.0–0.7)
Eosinophils Relative: 3 % (ref 0–5)
HCT: 40.6 % (ref 36.0–46.0)
HEMOGLOBIN: 13.5 g/dL (ref 12.0–15.0)
Lymphocytes Relative: 42 % (ref 12–46)
Lymphs Abs: 1.4 10*3/uL (ref 0.7–4.0)
MCH: 30.5 pg (ref 26.0–34.0)
MCHC: 33.3 g/dL (ref 30.0–36.0)
MCV: 91.6 fL (ref 78.0–100.0)
MPV: 11.1 fL (ref 8.6–12.4)
Monocytes Absolute: 0.3 10*3/uL (ref 0.1–1.0)
Monocytes Relative: 9 % (ref 3–12)
NEUTROS ABS: 1.5 10*3/uL — AB (ref 1.7–7.7)
Neutrophils Relative %: 45 % (ref 43–77)
PLATELETS: 216 10*3/uL (ref 150–400)
RBC: 4.43 MIL/uL (ref 3.87–5.11)
RDW: 12.8 % (ref 11.5–15.5)
WBC: 3.3 10*3/uL — ABNORMAL LOW (ref 4.0–10.5)

## 2015-06-09 MED ORDER — ETODOLAC 400 MG PO TABS: 400.0000 mg | ORAL_TABLET | Freq: Two times a day (BID) | ORAL | Status: DC

## 2015-06-09 MED ORDER — SOLIFENACIN SUCCINATE 10 MG PO TABS: 10.0000 mg | ORAL_TABLET | Freq: Every day | ORAL | Status: DC
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
## 2015-06-09 NOTE — Patient Instructions (Addendum)
  HEALTH MAINTENANCE RECOMMENDATIONS:  It is recommended that you get at least 30 minutes of aerobic exercise at least 5 days/week (for weight loss, you may need as much as 60-90 minutes). This can be any activity that gets your heart rate up. This can be divided in 10-15 minute intervals if needed, but try and build up your endurance at least once a week.  Weight bearing exercise is also recommended twice weekly.  Eat a healthy diet with lots of vegetables, fruits and fiber.  "Colorful" foods have a lot of vitamins (ie green vegetables, tomatoes, red peppers, etc).  Limit sweet tea, regular sodas and alcoholic beverages, all of which has a lot of calories and sugar.  Up to 1 alcoholic drink daily may be beneficial for women (unless trying to lose weight, watch sugars).  Drink a lot of water.  Calcium recommendations are 1200-1500 mg daily (1500 mg for postmenopausal women or women without ovaries), and vitamin D 1000 IU daily.  This should be obtained from diet and/or supplements (vitamins), and calcium should not be taken all at once, but in divided doses.  Monthly self breast exams and yearly mammograms for women over the age of 30 is recommended.  Sunscreen of at least SPF 30 should be used on all sun-exposed parts of the skin when outside between the hours of 10 am and 4 pm (not just when at beach or pool, but even with exercise, golf, tennis, and yard work!)  Use a sunscreen that says "broad spectrum" so it covers both UVA and UVB rays, and make sure to reapply every 1-2 hours.  Remember to change the batteries in your smoke detectors when changing your clock times in the spring and fall.  Use your seat belt every time you are in a car, and please drive safely and not be distracted with cell phones and texting while driving.  Please send [place] a message or call us when you get your flu shot to let us know the date you got it.  Check with your insurance about the shingles vaccine (zostavax) prior  to your next physical.  They often cover it starting at age 90.  Cologard is the fecal DNA testing that we talked about as an alternative screen for colon cancer.  You can check to see if your insurance covers this (if it is abnormal, then colonoscopy is recommended; if negative, can be done every 3 years).

## 2015-06-13 ENCOUNTER — Encounter: Payer: Self-pay | Admitting: *Deleted

## 2015-09-14 ENCOUNTER — Ambulatory Visit (INDEPENDENT_AMBULATORY_CARE_PROVIDER_SITE_OTHER): Payer: 59 | Admitting: Family Medicine

## 2015-09-14 ENCOUNTER — Encounter: Payer: Self-pay | Admitting: Family Medicine

## 2015-09-14 VITALS — BP 108/68 | HR 68 | Temp 97.3°F | Ht 70.5 in | Wt 181.0 lb

## 2015-09-14 DIAGNOSIS — J019 Acute sinusitis, unspecified: Secondary | ICD-10-CM

## 2015-09-14 MED ORDER — AZITHROMYCIN 250 MG PO TABS: ORAL_TABLET | ORAL | Status: DC

## 2015-09-14 NOTE — Progress Notes (Signed)
Chief Complaint  Patient presents with  . Cough    lots of drainage, ear are "fuzzy." First three days started with ST-better now. First symptoms started on Friday 09/01/15. Mucus is dark green, doesn't think she has had any fevers.    11/18 she started with sore throat x 3 days, then throat pain resolved but congestion started.  Denies sinus pain/pressure, but having postnasal drainage.  She was coughing up dark green phlegm 2 days ago, but started feeling a little better.  Then felt worse yesterday--fatigued, headache, ears feeling plugged.  She continues to feel the same today. Mucus from nose is clear.  Didn't get any phlegm up yesterday, was green on Monday.   She has been taking Mucinex D--this has helped a lot, but too drying, and wakes up frequently at night to drink water.  Denies dyspnea, still able to exercise--felt too bad to exercise yesterday, just used the steam room.  Last antibiotic used was z-pak, back in 10/2014, none since.  Partial knee replacement planned for May by Dr. Percell Miller.  PMH, [place], SH reviewed  Outpatient Encounter Prescriptions as of 09/14/2015  Medication Sig Note  . Astaxanthin 4 MG CAPS Take 1 capsule by mouth daily. 09/14/2015: (for small capillaries of extremities, per pt)  . Calcium-Vitamin D-Vitamin K (CALCIUM SOFT CHEWS PO) Take 1 tablet by mouth daily. 1000 mg calcium, 2000 IU  D3, 80 mcg K   . Coenzyme Q10 (CO Q-10) 50 MG CAPS Take 50 mg by mouth daily.   . diphenhydrAMINE (BENADRYL) 25 mg capsule Take 25 mg by mouth at bedtime as needed.   Marland Kitchen estradiol (ESTRACE) 0.5 MG tablet Take 0.5 mg by mouth daily.   Marland Kitchen etodolac (LODINE) 400 MG tablet Take 1 tablet (400 mg total) by mouth 2 (two) times daily.   Marland Kitchen GARCINIA CAMBOGIA-CHROMIUM PO Take 1,000 mg by mouth daily.   Marland Kitchen GLUCOSAMINE-CHONDROITIN-MSM PO Take 1 tablet by mouth 2 (two) times daily.   Marland Kitchen MAGNESIUM GLYCINATE PLUS PO Take 400 mg by mouth daily.   . Omega-3 Fatty Acids (EPA PLUS PO) Take 1,100 mg by  mouth daily.    . pseudoephedrine-guaifenesin (MUCINEX D) 60-600 MG 12 hr tablet Take 1 tablet by mouth every 12 (twelve) hours.   . Psyllium (METAMUCIL PO) Take 5 capsules by mouth daily.   . solifenacin (VESICARE) 10 MG tablet Take 1 tablet (10 mg total) by mouth daily.   . [DISCONTINUED] Chromium 200 MCG CAPS Take 200 mcg by mouth daily.   . fexofenadine (ALLEGRA) 180 MG tablet Take 180 mg by mouth daily. 06/09/2015: Uses prn   No facility-administered encounter medications on file as of 09/14/2015.   Allergies  Allergen Reactions  . Adhesive [Tape]     Steri Strips cause rash, regular tape is okay  . Aleve [Naproxen Sodium] Other (See Comments)    shakes  . Doxycycline Calcium Nausea Only  . Sulfa Antibiotics Rash    ROS:  No known fever, chills, nausea, vomiting, diarrhea.  Denies myalgias. Denies chest pain, palpitations. +cough, no shortness of breath.  See HPI.  PHYSICAL EXAM: BP 108/68 mmHg  Pulse 68  Temp(Src) 97.3 F (36.3 C) (Tympanic)  Ht 5' 10.5" (1.791 m)  Wt 181 lb (82.101 kg)  BMI 25.60 kg/m2 Pleasant, well appearing female in no distress. She is trying to clear phlegm from her throat during the visit.  No coughing during visit HEENT: PERRL, EOMI, conjunctiva clear.  Nasal mucosa is mild-moderately edematous, pale, no erythema or purulence.  Sinuses are nontender (but started having pressure/discomfort after palpation).  OP is clear--no erythema, exudate; moist mucus membranes Neck: no lymphadenopathy. Right sided thyroid nodule (nontender; unchanged per pt, and has f/u with dr. Buddy Duty) Heart: regular rate and rhythm without murmur Lungs: clear bilaterally Skin: normal turgor, no rash Neuro: alert and oriented. Cranial nerves intact. Normal strength, gait Psych: normal mood, affect, hygiene and grooming   ASSESSMENT/PLAN:  Acute sinusitis, recurrence not specified, unspecified location - Plan: azithromycin (ZITHROMAX) 250 MG tablet, DISCONTINUED: azithromycin  (ZITHROMAX) 250 MG tablet   Drink plenty of fluids. Continue Guaifenesin.  Avoid taking decongestants in the evening since it might contribute to keeping you awake and drying you out too much. You may use the decongestant (sudafed or Mucinex-D) during the day. Start the antibiotics today. If you are no better (or worse) on day 5-6, then call to have the antibiotics changed. Otherwise, if you are not 100% better by day 10, then get the refill of the z-pak and start taking it on day 11.  Most people do NOT need the second round, only if you aren't 100% better.

## 2015-09-14 NOTE — Patient Instructions (Signed)
Drink plenty of fluids. Continue Guaifenesin.  Avoid taking decongestants in the evening since it might contribute to keeping you awake and drying you out too much. You may use the decongestant (sudafed or Mucinex-D) during the day. Start the antibiotics today. If you are no better (or worse) on day 5-6, then call to have the antibiotics changed. Otherwise, if you are not 100% better by day 10, then get the refill of the z-pak and start taking it on day 11.  Most people do NOT need the second round, only if you aren't 100% better.

## 2015-10-19 ENCOUNTER — Telehealth: Payer: Self-pay | Admitting: *Deleted

## 2015-10-19 NOTE — Telephone Encounter (Signed)
Patient called and asked if we could add TSH to her labs 12/13/15-she did not have time to do when she last saw Dr.Kerr but he said he would be ok with her doing here one time, if okay with you. I told her I would only call her back if there was a problem, thanks.

## 2015-10-19 NOTE — Telephone Encounter (Signed)
That's okay, please add

## 2015-10-20 ENCOUNTER — Other Ambulatory Visit: Payer: Self-pay | Admitting: *Deleted

## 2015-10-20 DIAGNOSIS — E042 Nontoxic multinodular goiter: Secondary | ICD-10-CM

## 2015-11-29 ENCOUNTER — Other Ambulatory Visit: Payer: Self-pay | Admitting: Family Medicine

## 2015-11-29 NOTE — Telephone Encounter (Signed)
She has lab visit scheduled for 2/28 for c-met.  Refilled x 6 mos  Veronica--please make a sticky/reminder for [place] just to make sure that she doesn't no show her lab visit. Thanks

## 2015-11-29 NOTE — Telephone Encounter (Signed)
Is this ok to refill?  

## 2015-12-13 ENCOUNTER — Other Ambulatory Visit: Payer: 59

## 2015-12-13 DIAGNOSIS — Z5181 Encounter for therapeutic drug level monitoring: Secondary | ICD-10-CM

## 2015-12-13 DIAGNOSIS — E042 Nontoxic multinodular goiter: Secondary | ICD-10-CM

## 2015-12-13 LAB — TSH: TSH: 0.94 m[IU]/L

## 2015-12-14 LAB — COMPREHENSIVE METABOLIC PANEL
ALT: 16 U/L (ref 6–29)
AST: 20 U/L (ref 10–35)
Albumin: 3.9 g/dL (ref 3.6–5.1)
Alkaline Phosphatase: 59 U/L (ref 33–130)
BUN: 31 mg/dL — ABNORMAL HIGH (ref 7–25)
CHLORIDE: 105 mmol/L (ref 98–110)
CO2: 23 mmol/L (ref 20–31)
Calcium: 10.1 mg/dL (ref 8.6–10.4)
Creat: 0.69 mg/dL (ref 0.50–0.99)
Glucose, Bld: 95 mg/dL (ref 65–99)
POTASSIUM: 5.1 mmol/L (ref 3.5–5.3)
Sodium: 141 mmol/L (ref 135–146)
Total Bilirubin: 0.6 mg/dL (ref 0.2–1.2)
Total Protein: 6 g/dL — ABNORMAL LOW (ref 6.1–8.1)

## 2015-12-27 ENCOUNTER — Encounter: Payer: Self-pay | Admitting: Family Medicine

## 2015-12-27 ENCOUNTER — Ambulatory Visit
Admission: RE | Admit: 2015-12-27 | Discharge: 2015-12-27 | Disposition: A | Discharge status: Home / Self Care | Payer: 59 | Source: Ambulatory Visit | Attending: Family Medicine | Admitting: Family Medicine

## 2015-12-27 ENCOUNTER — Ambulatory Visit (INDEPENDENT_AMBULATORY_CARE_PROVIDER_SITE_OTHER): Payer: 59 | Admitting: Family Medicine

## 2015-12-27 VITALS — BP 116/68 | HR 64 | Temp 97.7°F | Wt 184.6 lb

## 2015-12-27 DIAGNOSIS — R059 Cough, unspecified: Secondary | ICD-10-CM

## 2015-12-27 DIAGNOSIS — J209 Acute bronchitis, unspecified: Secondary | ICD-10-CM

## 2015-12-27 DIAGNOSIS — R05 Cough: Secondary | ICD-10-CM

## 2015-12-27 DIAGNOSIS — R062 Wheezing: Secondary | ICD-10-CM | POA: Diagnosis not present

## 2015-12-27 MED ORDER — AZITHROMYCIN 250 MG PO TABS: ORAL_TABLET | ORAL | Status: DC

## 2015-12-27 MED ORDER — BENZONATATE 200 MG PO CAPS: 200.0000 mg | ORAL_CAPSULE | Freq: Two times a day (BID) | ORAL | Status: DC | PRN

## 2015-12-27 MED ORDER — ALBUTEROL SULFATE (2.5 MG/3ML) 0.083% IN NEBU
5.0000 mg | INHALATION_SOLUTION | Freq: Once | RESPIRATORY_TRACT | Status: AC
  Administered 2015-12-27: 5 mg via RESPIRATORY_TRACT

## 2015-12-27 MED ORDER — ALBUTEROL SULFATE HFA 108 (90 BASE) MCG/ACT IN AERS: 2.0000 | INHALATION_SPRAY | Freq: Four times a day (QID) | RESPIRATORY_TRACT | Status: DC | PRN
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
## 2015-12-27 NOTE — Patient Instructions (Addendum)
You received albuterol breathing treatments in our office. I am sending you for a chest XR to rule out underlying pneumonia. We will call you with results.  Use the inhaler as need for cough, wheezing as we discussed. If you are not back to 100% after day 10 of completing the antibiotic or if your symptoms get worse, call me.  Stay well hydrated and continue taking guaifenesin and decongestant during the day.   Cough, Adult Coughing is a reflex that clears your throat and your airways. Coughing helps to heal and protect your lungs. It is normal to cough occasionally, but a cough that happens with other symptoms or lasts a long time may be a sign of a condition that needs treatment. A cough may last only 2-3 weeks (acute), or it may last longer than 8 weeks (chronic). CAUSES Coughing is commonly caused by:  Breathing in substances that irritate your lungs.  A viral or bacterial respiratory infection.  Allergies.  Asthma.  Postnasal drip.  Smoking.  Acid backing up from the stomach into the esophagus (gastroesophageal reflux).  Certain medicines.  Chronic lung problems, including COPD (or rarely, lung cancer).  Other medical conditions such as heart failure. HOME CARE INSTRUCTIONS  Pay attention to any changes in your symptoms. Take these actions to help with your discomfort:  Take medicines only as told by your health care provider.  If you were prescribed an antibiotic medicine, take it as told by your health care provider. Do not stop taking the antibiotic even if you start to feel better.  Talk with your health care provider before you take a cough suppressant medicine.  Drink enough fluid to keep your urine clear or pale yellow.  If the air is dry, use a cold steam vaporizer or humidifier in your bedroom or your home to help loosen secretions.  Avoid anything that causes you to cough at work or at home.  If your cough is worse at night, try sleeping in a semi-upright  position.  Avoid cigarette smoke. If you smoke, quit smoking. If you need help quitting, ask your health care provider.  Avoid caffeine.  Avoid alcohol.  Rest as needed. SEEK MEDICAL CARE IF:   You have new symptoms.  You cough up pus.  Your cough does not get better after 2-3 weeks, or your cough gets worse.  You cannot control your cough with suppressant medicines and you are losing sleep.  You develop pain that is getting worse or pain that is not controlled with pain medicines.  You have a fever.  You have unexplained weight loss.  You have night sweats. SEEK IMMEDIATE MEDICAL CARE IF:  You cough up blood.  You have difficulty breathing.  Your heartbeat is very fast.   This information is not intended to replace advice given to you by your health care provider. Make sure you discuss any questions you have with your health care provider.   Document Released: 03/30/2011 Document Revised: 06/22/2015 Document Reviewed: 12/08/2014 Elsevier Interactive Patient Education [employer].

## 2016-01-05 ENCOUNTER — Encounter: Payer: Self-pay | Admitting: Family Medicine

## 2016-01-05 ENCOUNTER — Ambulatory Visit (INDEPENDENT_AMBULATORY_CARE_PROVIDER_SITE_OTHER): Payer: 59 | Admitting: Family Medicine

## 2016-01-05 VITALS — BP 108/68 | HR 60 | Temp 97.4°F | Ht 70.0 in | Wt 183.0 lb

## 2016-01-05 DIAGNOSIS — R05 Cough: Secondary | ICD-10-CM | POA: Diagnosis not present

## 2016-01-05 DIAGNOSIS — R059 Cough, unspecified: Secondary | ICD-10-CM

## 2016-01-05 MED ORDER — BENZONATATE 200 MG PO CAPS: 200.0000 mg | ORAL_CAPSULE | Freq: Two times a day (BID) | ORAL | Status: DC | PRN

## 2016-01-05 MED ORDER — PREDNISONE 20 MG PO TABS: 20.0000 mg | ORAL_TABLET | Freq: Two times a day (BID) | ORAL | Status: DC

## 2016-01-05 NOTE — Progress Notes (Signed)
Chief Complaint  Patient presents with  . Cough    saw Paula Hughes 10 days ago and was given Zpak, told to come in of not better in 10 days. She is not better, still coughing and wheezing.     Treated with zpak and albuterol last week for asthmatic bronchitis.  She has continued to take mucinex and sudafed.  Takes benadryl at bedtime and a dose of phenylephrine in the 5-6pm time frame (when 12 hour sudafed wears off, but causes insomnia if taken at night). She is also taking tessalon perles for the cough.  She feels somewhat better, but still feels like she is wheezing.  She is using albuterol inhaler three times daily, with some improvement only.  Mucus from nose and phlegm is all clear.  PMH, PSH, SH reviewed.  Outpatient Encounter Prescriptions as of 01/05/2016  Medication Sig Note  . albuterol (PROVENTIL HFA;VENTOLIN HFA) 108 (90 Base) MCG/ACT inhaler Inhale 2 puffs into the lungs every 6 (six) hours as needed for wheezing or shortness of breath.   . Astaxanthin 4 MG CAPS Take 1 capsule by mouth daily. 09/14/2015: (for small capillaries of extremities, per pt)  . benzonatate (TESSALON) 200 MG capsule Take 1 capsule (200 mg total) by mouth 2 (two) times daily as needed for cough.   . Coenzyme Q10 (CO Q-10) 50 MG CAPS Take 50 mg by mouth daily.   . diphenhydrAMINE (BENADRYL) 25 mg capsule Take 25 mg by mouth at bedtime as needed.   Marland Kitchen estradiol (ESTRACE) 0.5 MG tablet Take 0.5 mg by mouth daily.   Marland Kitchen etodolac (LODINE) 400 MG tablet TAKE 1 TABLET TWICE A DAY   . GARCINIA CAMBOGIA-CHROMIUM PO Take 1,000 mg by mouth daily.   Marland Kitchen GLUCOSAMINE-CHONDROITIN-MSM PO Take 1 tablet by mouth 2 (two) times daily.   Marland Kitchen MAGNESIUM GLYCINATE PLUS PO Take 400 mg by mouth daily.   . Omega-3 Fatty Acids (EPA PLUS PO) Take 1,100 mg by mouth daily.    . pseudoephedrine-guaifenesin (MUCINEX D) 60-600 MG 12 hr tablet Take 1 tablet by mouth every 12 (twelve) hours.   . Psyllium (METAMUCIL PO) Take 5 capsules by mouth daily.    . solifenacin (VESICARE) 10 MG tablet Take 1 tablet (10 mg total) by mouth daily.   . [DISCONTINUED] benzonatate (TESSALON) 200 MG capsule Take 1 capsule (200 mg total) by mouth 2 (two) times daily as needed for cough.   . Calcium-Vitamin D-Vitamin K (CALCIUM SOFT CHEWS PO) Take 1 tablet by mouth daily. Reported on 01/05/2016   . fexofenadine (ALLEGRA) 180 MG tablet Take 180 mg by mouth daily. Reported on 01/05/2016 06/09/2015: Uses prn  . predniSONE (DELTASONE) 20 MG tablet Take 1 tablet (20 mg total) by mouth 2 (two) times daily with a meal.   . [DISCONTINUED] azithromycin (ZITHROMAX Z-PAK) 250 MG tablet Take 2 tablets on day 1, then 1 tablet on days 2 through 5.    No facility-administered encounter medications on file as of 01/05/2016.   (prednisone rx'd today, not prior to visit)  Allergies  Allergen Reactions  . Adhesive [Tape]     Steri Strips cause rash, regular tape is okay  . Aleve [Naproxen Sodium] Other (See Comments)    shakes  . Doxycycline Calcium Nausea Only  . Sulfa Antibiotics Rash   ROS: no fever, chills, nausea, vomiting, diarrhea, bleeding, bruising, rash. Plans for knee surgery in May. No other joint pains or concerns. No chest pain.  See HPI  PHYSICAL EXAM: BP 108/68 mmHg  Pulse 60  Temp(Src) 97.4 F (36.3 C) (Tympanic)  Ht 5\' 10"  (1.778 m)  Wt 183 lb (83.008 kg)  BMI 26.26 kg/m2  SpO2 99% Well appearing, pleasant female in no distress. Speaking easily, in full sentences, no significant coughing during visit. HEENT: PERRL, EOMI, conjunctiva and sclera clear.  TM's and EAC's normal. Nasal mucosa was mild-mod edematous, very pale. No purulence or erythema. Sinuses nontender. OP is clear. Neck: goiter/nodules--unchanged, nontender. No lymphadenopathy. Heart: regular rate and rhythm Lungs: clear bilaterally.  ronchi at end-inspiration until she coughed--then lungs cleared entirely. No expiratory wheezes, and good air movement throughout. Neuro: alert and  oriented, cranial nerves intact, normal gait Psych: normal mood, affect, hygiene and grooming  ASSESSMENT/PLAN:  Cough - recent bronchitis w/persistent mild cough. postviral, vs related to allergies. Add allegra; prednisone course x 5d if not improving. Cont albuterol prn - Plan: predniSONE (DELTASONE) 20 MG tablet, benzonatate (TESSALON) 200 MG capsule   Add Allegra to your current regimen, taking it once daily. Start the prednisone (twice daily for 5 days) if not seeing improvement in the cough or wheezing.

## 2016-01-05 NOTE — Patient Instructions (Signed)
  Add Allegra to your current regimen, taking it once daily. Start the prednisone (twice daily for 5 days) if not seeing improvement in the cough or wheezing.

## 2016-02-06 ENCOUNTER — Other Ambulatory Visit: Payer: Self-pay | Admitting: Physician Assistant

## 2016-02-06 ENCOUNTER — Encounter (HOSPITAL_BASED_OUTPATIENT_CLINIC_OR_DEPARTMENT_OTHER): Payer: Self-pay | Admitting: *Deleted

## 2016-02-06 NOTE — H&P (Signed)
This is a pleasant active 60 year-old female who presents to our clinic today with continued left knee pain.  We saw Paula Hughes back in May of this past year where it was noted that she had moderate patellofemoral changes.  Other compartments looked okay.  We proceeded with an intraarticular injection to her left knee and sent her to outpatient physical therapy to work on specific exercises good for her condition of patellofemoral syndrome.  She states that she had moderate improvement following the injection, but due to the inability to perform certain exercises to include squatting, stairs and so forth, she feels as though this is greatly affecting her quality of life.  She comes in today to discuss the merits of going ahead and proceeding with unicompartmental knee replacement or waiting five years to when she is retired and on [employer].  Past medical, social and family history reviewed in detail on the patient questionnaire and signed.  Review of systems: As detailed in HPI.  All others reviewed and are negative.   EXAMINATION: Well-developed, well-nourished female in no acute distress.  Alert and oriented x 3.  Lungs clear to auscultation bilaterally.  Heart sounds normal.  Examination of her left knee reveals range of motion 0-125 degrees.  Marked patellofemoral crepitus.  Increased Q angle with tracking and some tethering.  Medial and lateral joint line tenderness.  She does have some medial and lateral joint line tenderness.  Negative log roll.  Negative straight leg raise.  She is neurovascularly intact distally.    IMPRESSION: Left knee patellofemoral compartment arthritis.    PLAN: We are going to proceed with a 2:6 intraarticular injection to the left knee to give Paula Hughes some relief.  At the end of the day this is going to come down to a patellofemoral replacement.  She would like to know if she should do this now or she will be able to do this in five years.  We have told her that to give [place] a  better answer to this question we are going to get an MRI of her left knee to assess the other compartments.  If there are absolutely no changes to the medial or lateral compartment we will suggest that she does this sooner than later.  If she already has wear to the medial and lateral compartments we are going to be okay waiting for this because this will in fact turn into a total knee replacement if she does wait five years.  We will wait and talk to Paula Hughes once her MRI is complete.    PROCEDURE NOTE: The patient's clinical condition is marked by substantial pain and/or significant functional disability.  Other conservative therapy has not provided relief, is contraindicated, or not appropriate.  There is a reasonable likelihood that injection will significantly improve the patient's pain and/or functional disability. Patient is seated on the exam table.  The left knee is prepped with Betadine and alcohol and injected with 80 mg of Depo-Medrol and 6 cc of Marcaine.  Patient tolerated the procedure without difficulty.   [name], M.D.  Addendum: I had Paula Hughes come in to re-meet with me and discuss timing and treatment of her left knee.  I have gone over her MRI report, as well as the scan.  Despite the report, she has profound end stage arthritis patellofemoral compartment.  They describe it as mild to moderate.  I have looked at the scan and there are areas of no cartilage.  Other compartments look good.  There is no demonstrable meniscal tear and there is certainly not a lot of degenerative change in other compartments.  I went over all of this with Paula Hughes.  I spent a great deal of time with her discussing options.  I have asked her to impress upon me that her symptoms are bad enough to do something now.  By the end of our discussion there is no question that the impact of her anterior left knee pain is profound, affecting all activities.  She has had to back off her working out, her exercise  program and it is even affecting activities of daily living.  In that regard we are going to go ahead and proceed with scheduling and planning for a unicompartmental patellofemoral replacement, left knee.  Looking at this as a long-term cure.  She understands.  Procedure, risks, benefits and complications reviewed.  We will do this on an outpatient basis.  She is going to be scheduling it later this fall or early next year.  It is my sincere hope that she is not going to proceed to total knee replacement at any time in the near future.    [name], M.D.

## 2016-02-07 ENCOUNTER — Encounter (HOSPITAL_BASED_OUTPATIENT_CLINIC_OR_DEPARTMENT_OTHER)
Admission: RE | Admit: 2016-02-07 | Discharge: 2016-02-07 | Disposition: A | Discharge status: Home / Self Care | Payer: 59 | Source: Ambulatory Visit | Attending: Orthopedic Surgery | Admitting: Orthopedic Surgery

## 2016-02-07 ENCOUNTER — Encounter: Payer: Self-pay | Admitting: Family Medicine

## 2016-02-07 DIAGNOSIS — M1712 Unilateral primary osteoarthritis, left knee: Secondary | ICD-10-CM | POA: Diagnosis not present

## 2016-02-07 LAB — SURGICAL PCR SCREEN
MRSA, PCR: NEGATIVE
STAPHYLOCOCCUS AUREUS: NEGATIVE

## 2016-02-07 NOTE — Progress Notes (Signed)
Pt. notified of negative results; to take shower with CHG as directed the night before surgery and the morning of surgery.

## 2016-02-16 ENCOUNTER — Encounter (HOSPITAL_BASED_OUTPATIENT_CLINIC_OR_DEPARTMENT_OTHER)
Admission: RE | Disposition: A | Discharge status: Home / Self Care | Payer: Self-pay | Source: Ambulatory Visit | Attending: Orthopedic Surgery

## 2016-02-16 ENCOUNTER — Ambulatory Visit (HOSPITAL_BASED_OUTPATIENT_CLINIC_OR_DEPARTMENT_OTHER): Payer: 59 | Admitting: Anesthesiology

## 2016-02-16 ENCOUNTER — Ambulatory Visit (HOSPITAL_BASED_OUTPATIENT_CLINIC_OR_DEPARTMENT_OTHER)
Admission: RE | Admit: 2016-02-16 | Discharge: 2016-02-16 | Disposition: A | Discharge status: Home / Self Care | Payer: 59 | Source: Ambulatory Visit | Attending: Orthopedic Surgery | Admitting: Orthopedic Surgery

## 2016-02-16 ENCOUNTER — Encounter (HOSPITAL_BASED_OUTPATIENT_CLINIC_OR_DEPARTMENT_OTHER): Payer: Self-pay | Admitting: *Deleted

## 2016-02-16 DIAGNOSIS — M1712 Unilateral primary osteoarthritis, left knee: Secondary | ICD-10-CM | POA: Insufficient documentation

## 2016-02-16 DIAGNOSIS — Z96652 Presence of left artificial knee joint: Secondary | ICD-10-CM

## 2016-02-16 HISTORY — DX: Gastro-esophageal reflux disease without esophagitis: K21.9

## 2016-02-16 HISTORY — PX: PATELLA-FEMORAL ARTHROPLASTY: SHX5037

## 2016-02-16 SURGERY — ARTHROPLASTY, PATELLOFEMORAL
Anesthesia: General | Site: Knee | Laterality: Left

## 2016-02-16 MED ORDER — SCOPOLAMINE 1 MG/3DAYS TD PT72
1.0000 | MEDICATED_PATCH | Freq: Once | TRANSDERMAL | Status: DC | PRN
  Administered 2016-02-16: 1.5 mg via TRANSDERMAL

## 2016-02-16 MED ORDER — MIDAZOLAM HCL 2 MG/2ML IJ SOLN
INTRAMUSCULAR | Status: AC
  Filled 2016-02-16: qty 2

## 2016-02-16 MED ORDER — SODIUM CHLORIDE 0.9 % IR SOLN
Status: DC | PRN
  Administered 2016-02-16: 1000 mL

## 2016-02-16 MED ORDER — SODIUM CHLORIDE 0.9 % IJ SOLN
INTRAMUSCULAR | Status: AC
  Filled 2016-02-16: qty 10

## 2016-02-16 MED ORDER — OXYCODONE HCL 5 MG/5ML PO SOLN: 5.0000 mg | Freq: Once | ORAL | Status: DC | PRN

## 2016-02-16 MED ORDER — DOCUSATE SODIUM 100 MG PO CAPS: 100.0000 mg | ORAL_CAPSULE | Freq: Two times a day (BID) | ORAL | Status: DC

## 2016-02-16 MED ORDER — MEPERIDINE HCL 25 MG/ML IJ SOLN: 6.2500 mg | INTRAMUSCULAR | Status: DC | PRN

## 2016-02-16 MED ORDER — MIDAZOLAM HCL 2 MG/2ML IJ SOLN
1.0000 mg | INTRAMUSCULAR | Status: DC | PRN
  Administered 2016-02-16: 2 mg via INTRAVENOUS

## 2016-02-16 MED ORDER — CEFAZOLIN SODIUM-DEXTROSE 2-4 GM/100ML-% IV SOLN: 2.0000 g | Freq: Four times a day (QID) | INTRAVENOUS | Status: DC

## 2016-02-16 MED ORDER — ONDANSETRON HCL 4 MG/2ML IJ SOLN
INTRAMUSCULAR | Status: AC
  Filled 2016-02-16: qty 2

## 2016-02-16 MED ORDER — METHOCARBAMOL 500 MG PO TABS
500.0000 mg | ORAL_TABLET | Freq: Four times a day (QID) | ORAL | Status: DC
Start: 2016-02-16 — End: 2016-06-13

## 2016-02-16 MED ORDER — LIDOCAINE HCL (CARDIAC) 20 MG/ML IV SOLN
INTRAVENOUS | Status: DC | PRN
  Administered 2016-02-16: 80 mg via INTRAVENOUS

## 2016-02-16 MED ORDER — OXYCODONE-ACETAMINOPHEN 5-325 MG PO TABS
1.0000 | ORAL_TABLET | ORAL | Status: DC | PRN
  Administered 2016-02-16: 1 via ORAL

## 2016-02-16 MED ORDER — KETOROLAC TROMETHAMINE 30 MG/ML IJ SOLN
30.0000 mg | Freq: Four times a day (QID) | INTRAMUSCULAR | Status: DC | PRN
  Administered 2016-02-16: 30 mg via INTRAVENOUS

## 2016-02-16 MED ORDER — BISACODYL 5 MG PO TBEC: 5.0000 mg | DELAYED_RELEASE_TABLET | Freq: Every day | ORAL | Status: DC | PRN

## 2016-02-16 MED ORDER — DEXAMETHASONE SODIUM PHOSPHATE 10 MG/ML IJ SOLN
INTRAMUSCULAR | Status: AC
  Filled 2016-02-16: qty 1

## 2016-02-16 MED ORDER — FENTANYL CITRATE (PF) 100 MCG/2ML IJ SOLN
INTRAMUSCULAR | Status: AC
  Filled 2016-02-16: qty 2

## 2016-02-16 MED ORDER — ONDANSETRON HCL 4 MG/2ML IJ SOLN
INTRAMUSCULAR | Status: DC | PRN
Start: 2016-02-16 — End: 2016-02-16
  Administered 2016-02-16: 4 mg via INTRAVENOUS

## 2016-02-16 MED ORDER — ONDANSETRON HCL 4 MG PO TABS: 4.0000 mg | ORAL_TABLET | Freq: Four times a day (QID) | ORAL | Status: DC | PRN

## 2016-02-16 MED ORDER — METOCLOPRAMIDE HCL 5 MG PO TABS: 5.0000 mg | ORAL_TABLET | Freq: Three times a day (TID) | ORAL | Status: DC | PRN

## 2016-02-16 MED ORDER — HYDROMORPHONE HCL 1 MG/ML IJ SOLN
INTRAMUSCULAR | Status: AC
  Filled 2016-02-16: qty 1

## 2016-02-16 MED ORDER — ONDANSETRON HCL 4 MG PO TABS: 4.0000 mg | ORAL_TABLET | Freq: Three times a day (TID) | ORAL | Status: DC | PRN

## 2016-02-16 MED ORDER — PROPOFOL 10 MG/ML IV BOLUS
INTRAVENOUS | Status: DC | PRN
  Administered 2016-02-16: 200 mg via INTRAVENOUS

## 2016-02-16 MED ORDER — FENTANYL CITRATE (PF) 100 MCG/2ML IJ SOLN
50.0000 ug | INTRAMUSCULAR | Status: DC | PRN
  Administered 2016-02-16: 100 ug via INTRAVENOUS

## 2016-02-16 MED ORDER — CHLORHEXIDINE GLUCONATE 4 % EX LIQD: 60.0000 mL | Freq: Once | CUTANEOUS | Status: DC

## 2016-02-16 MED ORDER — OXYCODONE HCL 5 MG PO TABS: 5.0000 mg | ORAL_TABLET | Freq: Once | ORAL | Status: DC | PRN

## 2016-02-16 MED ORDER — SCOPOLAMINE 1 MG/3DAYS TD PT72
MEDICATED_PATCH | TRANSDERMAL | Status: AC
  Filled 2016-02-16: qty 1

## 2016-02-16 MED ORDER — LIDOCAINE 2% (20 MG/ML) 5 ML SYRINGE
INTRAMUSCULAR | Status: AC
  Filled 2016-02-16: qty 5

## 2016-02-16 MED ORDER — MORPHINE SULFATE (PF) 10 MG/ML IV SOLN
INTRAVENOUS | Status: DC | PRN
  Administered 2016-02-16: 3 mg via BUCCAL

## 2016-02-16 MED ORDER — HYDROMORPHONE HCL 1 MG/ML IJ SOLN: 0.5000 mg | INTRAMUSCULAR | Status: DC | PRN

## 2016-02-16 MED ORDER — GLYCOPYRROLATE 0.2 MG/ML IJ SOLN: 0.2000 mg | Freq: Once | INTRAMUSCULAR | Status: DC | PRN

## 2016-02-16 MED ORDER — HYDROMORPHONE HCL 1 MG/ML IJ SOLN
0.2500 mg | INTRAMUSCULAR | Status: DC | PRN
  Administered 2016-02-16: 0.5 mg via INTRAVENOUS

## 2016-02-16 MED ORDER — LACTATED RINGERS IV SOLN
INTRAVENOUS | Status: DC
  Administered 2016-02-16 (×2): via INTRAVENOUS

## 2016-02-16 MED ORDER — MAGNESIUM CITRATE PO SOLN: 1.0000 | Freq: Once | ORAL | Status: DC | PRN

## 2016-02-16 MED ORDER — BUPIVACAINE LIPOSOME 1.3 % IJ SUSP
INTRAMUSCULAR | Status: DC | PRN
  Administered 2016-02-16: 20 mL

## 2016-02-16 MED ORDER — OXYCODONE-ACETAMINOPHEN 5-325 MG PO TABS: 1.0000 | ORAL_TABLET | ORAL | Status: DC | PRN

## 2016-02-16 MED ORDER — POLYETHYLENE GLYCOL 3350 17 G PO PACK: 17.0000 g | PACK | Freq: Every day | ORAL | Status: DC | PRN

## 2016-02-16 MED ORDER — ASTAXANTHIN 4 MG PO CAPS: 1.0000 | ORAL_CAPSULE | Freq: Every day | ORAL | Status: DC

## 2016-02-16 MED ORDER — DEXAMETHASONE SODIUM PHOSPHATE 4 MG/ML IJ SOLN
INTRAMUSCULAR | Status: DC | PRN
  Administered 2016-02-16: 10 mg via INTRAVENOUS

## 2016-02-16 MED ORDER — DARIFENACIN HYDROBROMIDE ER 15 MG PO TB24: 15.0000 mg | ORAL_TABLET | Freq: Every day | ORAL | Status: DC

## 2016-02-16 MED ORDER — BISACODYL 10 MG RE SUPP: 10.0000 mg | Freq: Every day | RECTAL | Status: DC | PRN

## 2016-02-16 MED ORDER — ONDANSETRON HCL 4 MG/2ML IJ SOLN: 4.0000 mg | Freq: Four times a day (QID) | INTRAMUSCULAR | Status: DC | PRN

## 2016-02-16 MED ORDER — KETOROLAC TROMETHAMINE 30 MG/ML IJ SOLN
INTRAMUSCULAR | Status: AC
  Filled 2016-02-16: qty 1

## 2016-02-16 MED ORDER — CEFAZOLIN SODIUM-DEXTROSE 2-4 GM/100ML-% IV SOLN
2.0000 g | INTRAVENOUS | Status: AC
  Administered 2016-02-16: 2 g via INTRAVENOUS

## 2016-02-16 MED ORDER — BUPIVACAINE LIPOSOME 1.3 % IJ SUSP
INTRAMUSCULAR | Status: AC
  Filled 2016-02-16: qty 20

## 2016-02-16 MED ORDER — CEFAZOLIN SODIUM-DEXTROSE 2-4 GM/100ML-% IV SOLN
INTRAVENOUS | Status: AC
  Filled 2016-02-16: qty 100

## 2016-02-16 MED ORDER — METOCLOPRAMIDE HCL 5 MG/ML IJ SOLN: 5.0000 mg | Freq: Three times a day (TID) | INTRAMUSCULAR | Status: DC | PRN

## 2016-02-16 MED ORDER — LACTATED RINGERS IV SOLN: INTRAVENOUS | Status: DC

## 2016-02-16 MED ORDER — MORPHINE SULFATE (PF) 10 MG/ML IV SOLN
INTRAVENOUS | Status: AC
  Filled 2016-02-16: qty 1

## 2016-02-16 MED ORDER — ETODOLAC 400 MG PO TABS: 400.0000 mg | ORAL_TABLET | Freq: Two times a day (BID) | ORAL | Status: DC

## 2016-02-16 MED ORDER — APIXABAN 2.5 MG PO TABS: 2.5000 mg | ORAL_TABLET | Freq: Two times a day (BID) | ORAL | Status: DC

## 2016-02-16 MED ORDER — ESTRADIOL 1 MG PO TABS: 0.5000 mg | ORAL_TABLET | Freq: Every day | ORAL | Status: DC

## 2016-02-16 MED ORDER — OXYCODONE-ACETAMINOPHEN 5-325 MG PO TABS
ORAL_TABLET | ORAL | Status: AC
  Filled 2016-02-16: qty 1

## 2016-02-16 MED ORDER — APIXABAN 2.5 MG PO TABS
ORAL_TABLET | ORAL | Status: DC
Start: 2016-02-16 — End: 2016-06-13

## 2016-02-16 SURGICAL SUPPLY — 79 items
APL SKNCLS STERI-STRIP NONHPOA (GAUZE/BANDAGES/DRESSINGS) ×1
BAG DECANTER FOR FLEXI CONT (MISCELLANEOUS) ×3 IMPLANT
BANDAGE ACE 4X5 VEL STRL LF (GAUZE/BANDAGES/DRESSINGS) ×3 IMPLANT
BANDAGE ACE 6X5 VEL STRL LF (GAUZE/BANDAGES/DRESSINGS) ×3 IMPLANT
BANDAGE ESMARK 6X9 LF (GAUZE/BANDAGES/DRESSINGS) ×1 IMPLANT
BENZOIN TINCTURE PRP APPL 2/3 (GAUZE/BANDAGES/DRESSINGS) ×3 IMPLANT
BLADE SAG 18X100X1.27 (BLADE) ×3 IMPLANT
BLADE SAW SGTL 13.0X1.19X90.0M (BLADE) IMPLANT
BLADE SURG 15 STRL LF DISP TIS (BLADE) ×2 IMPLANT
BLADE SURG 15 STRL SS (BLADE) ×6
BNDG CMPR 9X6 STRL LF SNTH (GAUZE/BANDAGES/DRESSINGS) ×1
BNDG ESMARK 6X9 LF (GAUZE/BANDAGES/DRESSINGS) ×3
BOWL SMART MIX CTS (DISPOSABLE) ×3 IMPLANT
BUR SURG 4X8 MED (BURR) IMPLANT
BURR SURG 4MMX8MM MEDIUM (BURR)
BURR SURG 4X8 MED (BURR)
CEMENT BONE SIMPLEX SPEEDSET (Cement) ×3 IMPLANT
CLOSURE WOUND 1/2 X4 (GAUZE/BANDAGES/DRESSINGS) ×1
COVER BACK TABLE 60X90IN (DRAPES) ×3 IMPLANT
CUFF TOURNIQUET SINGLE 34IN LL (TOURNIQUET CUFF) ×2 IMPLANT
DECANTER SPIKE VIAL GLASS SM (MISCELLANEOUS) ×3 IMPLANT
DRAPE EXTREMITY T 121X128X90 (DRAPE) ×3 IMPLANT
DRAPE INCISE IOBAN 66X45 STRL (DRAPES) ×3 IMPLANT
DRAPE U-SHAPE 47X51 STRL (DRAPES) ×3 IMPLANT
DURAPREP 26ML APPLICATOR (WOUND CARE) ×3 IMPLANT
ELECT REM PT RETURN 9FT ADLT (ELECTROSURGICAL) ×3
ELECTRODE REM PT RTRN 9FT ADLT (ELECTROSURGICAL) ×1 IMPLANT
FEM TROCHLEA XLG +7.0X-5.0 (Orthopedic Implant) ×2 IMPLANT
GAUZE SPONGE 4X4 12PLY STRL (GAUZE/BANDAGES/DRESSINGS) ×3 IMPLANT
GAUZE XEROFORM 5X9 LF (GAUZE/BANDAGES/DRESSINGS) ×3 IMPLANT
GLOVE BIOGEL PI IND STRL 7.0 (GLOVE) ×1 IMPLANT
GLOVE BIOGEL PI INDICATOR 7.0 (GLOVE) ×2
GLOVE ECLIPSE 7.0 STRL STRAW (GLOVE) ×3 IMPLANT
GLOVE SURG ORTHO 8.0 STRL STRW (GLOVE) ×3 IMPLANT
GOWN STRL REUS W/ TWL LRG LVL3 (GOWN DISPOSABLE) ×2 IMPLANT
GOWN STRL REUS W/ TWL XL LVL3 (GOWN DISPOSABLE) ×1 IMPLANT
GOWN STRL REUS W/TWL LRG LVL3 (GOWN DISPOSABLE) ×6
GOWN STRL REUS W/TWL XL LVL3 (GOWN DISPOSABLE) ×3
HANDPIECE INTERPULSE COAX TIP (DISPOSABLE)
IMMOBILIZER KNEE 22 UNIV (SOFTGOODS) ×3 IMPLANT
IMMOBILIZER KNEE 24 THIGH 36 (MISCELLANEOUS) ×1 IMPLANT
IMMOBILIZER KNEE 24 UNIV (MISCELLANEOUS) ×3
KIT PIN (KITS) IMPLANT
KNEE WRAP E Z 3 GEL PACK (MISCELLANEOUS) ×3 IMPLANT
NDL SAFETY ECLIPSE 18X1.5 (NEEDLE) ×2 IMPLANT
NEEDLE HYPO 18GX1.5 SHARP (NEEDLE) ×6
NS IRRIG 1000ML POUR BTL (IV SOLUTION) ×3 IMPLANT
PACK ARTHROSCOPY DSU (CUSTOM PROCEDURE TRAY) ×3 IMPLANT
PACK BASIN DAY SURGERY FS (CUSTOM PROCEDURE TRAY) ×3 IMPLANT
PAD CAST 4YDX4 CTTN HI CHSV (CAST SUPPLIES) IMPLANT
PADDING CAST COTTON 4X4 STRL (CAST SUPPLIES)
PADDING CAST COTTON 6X4 STRL (CAST SUPPLIES) IMPLANT
PATELLA A 35MMX10MM (Knees) ×2 IMPLANT
PENCIL BUTTON HOLSTER BLD 10FT (ELECTRODE) ×3 IMPLANT
POST TAPER 11MM (Orthopedic Implant) ×2 IMPLANT
SET HNDPC FAN SPRY TIP SCT (DISPOSABLE) ×1 IMPLANT
SHEET MEDIUM DRAPE 40X70 STRL (DRAPES) ×3 IMPLANT
SPONGE LAP 18X18 X RAY DECT (DISPOSABLE) ×3 IMPLANT
STAPLER VISISTAT 35W (STAPLE) IMPLANT
STRIP CLOSURE SKIN 1/2X4 (GAUZE/BANDAGES/DRESSINGS) ×2 IMPLANT
SUCTION FRAZIER HANDLE 10FR (MISCELLANEOUS) ×2
SUCTION TUBE FRAZIER 10FR DISP (MISCELLANEOUS) ×1 IMPLANT
SUT FIBERWIRE #2 38 T-5 BLUE (SUTURE)
SUT MNCRL AB 4-0 PS2 18 (SUTURE) ×3 IMPLANT
SUT VIC AB 0 CT1 27 (SUTURE)
SUT VIC AB 0 CT1 27XBRD ANBCTR (SUTURE) IMPLANT
SUT VIC AB 0 SH 27 (SUTURE) IMPLANT
SUT VIC AB 1 CT1 27 (SUTURE)
SUT VIC AB 1 CT1 27XBRD ANBCTR (SUTURE) IMPLANT
SUT VIC AB 2-0 SH 27 (SUTURE) ×3
SUT VIC AB 2-0 SH 27XBRD (SUTURE) ×1 IMPLANT
SUTURE FIBERWR #2 38 T-5 BLUE (SUTURE) IMPLANT
SYR 50ML LL SCALE MARK (SYRINGE) ×3 IMPLANT
SYR BULB 3OZ (MISCELLANEOUS) ×3 IMPLANT
TOWEL OR 17X24 6PK STRL BLUE (TOWEL DISPOSABLE) ×3 IMPLANT
TOWEL OR NON WOVEN STRL DISP B (DISPOSABLE) ×6 IMPLANT
TUBE CONNECTING 20'X1/4 (TUBING) ×1
TUBE CONNECTING 20X1/4 (TUBING) ×2 IMPLANT
YANKAUER SUCT BULB TIP NO VENT (SUCTIONS) ×3 IMPLANT

## 2016-02-16 NOTE — Anesthesia Procedure Notes (Addendum)
Anesthesia Regional Block:  Femoral nerve block  Pre-Anesthetic Checklist: ,, timeout performed, Correct Patient, Correct Site, Correct Laterality, Correct Procedure, Correct Position, site marked, Risks and benefits discussed,  Surgical consent,  Pre-op evaluation,  At surgeon's request and post-op pain management  Laterality: Left and Lower  Prep: chloraprep       Needles:  Injection technique: Single-shot  Needle Type: Echogenic Needle     Needle Length: 9cm 9 cm Needle Gauge: 21 and 21 G    Additional Needles:  Procedures: ultrasound guided (picture in chart) Femoral nerve block Narrative:  Start time: 02/16/2016 8:32 AM End time: 02/16/2016 8:36 AM Injection made incrementally with aspirations every 5 mL.  Performed by: Personally  Anesthesiologist: CREWS, DAVID   Procedure Name: LMA Insertion Performed by: Terrance Mass Pre-anesthesia Checklist: Patient identified, Emergency Drugs available, Suction available and Patient being monitored Patient Re-evaluated:Patient Re-evaluated prior to inductionOxygen Delivery Method: Circle System Utilized Preoxygenation: Pre-oxygenation with 100% oxygen Intubation Type: IV induction Ventilation: Mask ventilation without difficulty LMA: LMA inserted LMA Size: 4.0 Number of attempts: 1 Placement Confirmation: positive ETCO2 Tube secured with: Tape Dental Injury: Teeth and Oropharynx as per pre-operative assessment       Left FNB image

## 2016-02-16 NOTE — Anesthesia Preprocedure Evaluation (Signed)
Anesthesia Evaluation  Patient identified by MRN, date of birth, ID band Patient awake    Reviewed: Allergy & Precautions, NPO status , Patient's Chart, lab work & pertinent test results  History of Anesthesia Complications (+) PONV  Airway Mallampati: I  TM Distance: >3 FB Neck ROM: Full    Dental  (+) Teeth Intact, Dental Advisory Given   Pulmonary    breath sounds clear to auscultation       Cardiovascular  Rhythm:Regular Rate:Normal     Neuro/Psych    GI/Hepatic GERD  Medicated and Controlled,  Endo/Other    Renal/GU      Musculoskeletal   Abdominal   Peds  Hematology   Anesthesia Other Findings   Reproductive/Obstetrics                             Anesthesia Physical Anesthesia Plan  ASA: II  Anesthesia Plan: General   Post-op Pain Management: GA combined w/ Regional for post-op pain   Induction: Intravenous  Airway Management Planned: LMA  Additional Equipment:   Intra-op Plan:   Post-operative Plan: Extubation in OR  Informed Consent: I have reviewed the patients History and Physical, chart, labs and discussed the procedure including the risks, benefits and alternatives for the proposed anesthesia with the patient or authorized representative who has indicated his/her understanding and acceptance.   Dental advisory given  Plan Discussed with: CRNA, Anesthesiologist and Surgeon  Anesthesia Plan Comments:         Anesthesia Quick Evaluation  

## 2016-02-16 NOTE — Discharge Instructions (Signed)
INSTRUCTIONS AFTER JOINT REPLACEMENT  ° °o Remove items at home which could result in a fall. This includes throw rugs or furniture in walking pathways °o ICE to the affected joint every three hours while awake for 30 minutes at a time, for at least the first 3-5 days, and then as needed for pain and swelling.  Continue to use ice for pain and swelling. You may notice swelling that will progress down to the foot and ankle.  This is normal after surgery.  Elevate your leg when you are not up walking on it.   °o Continue to use the breathing machine you got in the hospital (incentive spirometer) which will help keep your temperature down.  It is common for your temperature to cycle up and down following surgery, especially at night when you are not up moving around and exerting yourself.  The breathing machine keeps your lungs expanded and your temperature down. ° ° °DIET:  As you were doing prior to hospitalization, we recommend a well-balanced diet. ° °DRESSING / WOUND CARE / SHOWERING ° °Keep the surgical dressing until follow up.  The dressing is water proof, so you can shower without any extra covering.  IF THE DRESSING FALLS OFF or the wound gets wet inside, change the dressing with sterile gauze.  Please use good hand washing techniques before changing the dressing.  Do not use any lotions or creams on the incision until instructed by your surgeon.   ° °ACTIVITY ° °o Increase activity slowly as tolerated, but follow the weight bearing instructions below.   °o No driving for 6 weeks or until further direction given by your physician.  You cannot drive while taking narcotics.  °o No lifting or carrying greater than 10 lbs. until further directed by your surgeon. °o Avoid periods of inactivity such as sitting longer than an hour when not asleep. This helps prevent blood clots.  °o You may return to work once you are authorized by your doctor.  ° ° ° °WEIGHT BEARING  ° °Weight bearing as tolerated with assist  device (walker, cane, etc) as directed, use it as long as suggested by your surgeon or therapist, typically at least 4-6 weeks. ° ° °EXERCISES ° °Results after joint replacement surgery are often greatly improved when you follow the exercise, range of motion and muscle strengthening exercises prescribed by your doctor. Safety measures are also important to protect the joint from further injury. Any time any of these exercises cause you to have increased pain or swelling, decrease what you are doing until you are comfortable again and then slowly increase them. If you have problems or questions, call your caregiver or physical therapist for advice.  ° °Rehabilitation is important following a joint replacement. After just a few days of immobilization, the muscles of the leg can become weakened and shrink (atrophy).  These exercises are designed to build up the tone and strength of the thigh and leg muscles and to improve motion. Often times heat used for twenty to thirty minutes before working out will loosen up your tissues and help with improving the range of motion but do not use heat for the first two weeks following surgery (sometimes heat can increase post-operative swelling).  ° °These exercises can be done on a training (exercise) mat, on the floor, on a table or on a bed. Use whatever works the best and is most comfortable for you.    Use music or television while you are exercising so that   the exercises are a pleasant break in your day. This will make your life better with the exercises acting as a break in your routine that you can look forward to.   Perform all exercises about fifteen times, three times per day or as directed.  You should exercise both the operative leg and the other leg as well. ° °Exercises include: °  °• Quad Sets - Tighten up the muscle on the front of the thigh (Quad) and hold for 5-10 seconds.   °• Straight Leg Raises - With your knee straight (if you were given a brace, keep it on),  lift the leg to 60 degrees, hold for 3 seconds, and slowly lower the leg.  Perform this exercise against resistance later as your leg gets stronger.  °• Leg Slides: Lying on your back, slowly slide your foot toward your buttocks, bending your knee up off the floor (only go as far as is comfortable). Then slowly slide your foot back down until your leg is flat on the floor again.  °• Angel Wings: Lying on your back spread your legs to the side as far apart as you can without causing discomfort.  °• Hamstring Strength:  Lying on your back, push your heel against the floor with your leg straight by tightening up the muscles of your buttocks.  Repeat, but this time bend your knee to a comfortable angle, and push your heel against the floor.  You may put a pillow under the heel to make it more comfortable if necessary.  ° °A rehabilitation program following joint replacement surgery can speed recovery and prevent re-injury in the future due to weakened muscles. Contact your doctor or a physical therapist for more information on knee rehabilitation.  ° ° °CONSTIPATION ° °Constipation is defined medically as fewer than three stools per week and severe constipation as less than one stool per week.  Even if you have a regular bowel pattern at home, your normal regimen is likely to be disrupted due to multiple reasons following surgery.  Combination of anesthesia, postoperative narcotics, change in appetite and fluid intake all can affect your bowels.  ° °YOU MUST use at least one of the following options; they are listed in order of increasing strength to get the job done.  They are all available over the counter, and you may need to use some, POSSIBLY even all of these options:   ° °Drink plenty of fluids (prune juice may be helpful) and high fiber foods °Colace 100 mg by mouth twice a day  °Senokot for constipation as directed and as needed Dulcolax (bisacodyl), take with full glass of water  °Miralax (polyethylene glycol)  once or twice a day as needed. ° °If you have tried all these things and are unable to have a bowel movement in the first 3-4 days after surgery call either your surgeon or your primary doctor.   ° °If you experience loose stools or diarrhea, hold the medications until you stool forms back up.  If your symptoms do not get better within 1 week or if they get worse, check with your doctor.  If you experience "the worst abdominal pain ever" or develop nausea or vomiting, please contact the office immediately for further recommendations for treatment. ° ° °ITCHING:  If you experience itching with your medications, try taking only a single pain pill, or even half a pain pill at a time.  You can also use Benadryl over the counter for itching or also to   help with sleep.   TED HOSE STOCKINGS:  Use stockings on both legs until for at least 2 weeks or as directed by physician office. They may be removed at night for sleeping.  MEDICATIONS:  See your medication summary on the After Visit Summary that nursing will review with you.  You may have some home medications which will be placed on hold until you complete the course of blood thinner medication.  It is important for you to complete the blood thinner medication as prescribed.  PRECAUTIONS:  If you experience chest pain or shortness of breath - call 911 immediately for transfer to the hospital emergency department.   If you develop a fever greater that 101 F, purulent drainage from wound, increased redness or drainage from wound, foul odor from the wound/dressing, or calf pain - CONTACT YOUR SURGEON.                                                   FOLLOW-UP APPOINTMENTS:  If you do not already have a post-op appointment, please call the office for an appointment to be seen by your surgeon.  Guidelines for how soon to be seen are listed in your After Visit Summary, but are typically between 1-4 weeks after surgery.  OTHER INSTRUCTIONS:   Knee  Replacement:  Do not place pillow under knee, focus on keeping the knee straight while resting. CPM instructions: 0-90 degrees, 2 hours in the morning, 2 hours in the afternoon, and 2 hours in the evening. Place foam block, curve side up under heel at all times except when in CPM or when walking.  DO NOT modify, tear, cut, or change the foam block in any way.  MAKE SURE YOU:   Understand these instructions.   Get help right away if you are not doing well or get worse.    Thank you for letting us be a part of your medical care team.  It is a privilege we respect greatly.  We hope these instructions will help you stay on track for a fast and full recovery!   Regional Anesthesia Blocks  1. Numbness or the inability to move the "blocked" extremity may last from 3-48 hours after placement. The length of time depends on the medication injected and your individual response to the medication. If the numbness is not going away after 48 hours, call your surgeon.  2. The extremity that is blocked will need to be protected until the numbness is gone and the  Strength has returned. Because you cannot feel it, you will need to take extra care to avoid injury. Because it may be weak, you may have difficulty moving it or using it. You may not know what position it is in without looking at it while the block is in effect.  3. For blocks in the legs and feet, returning to weight bearing and walking needs to be done carefully. You will need to wait until the numbness is entirely gone and the strength has returned. You should be able to move your leg and foot normally before you try and bear weight or walk. You will need someone to be with you when you first try to ensure you do not fall and possibly risk injury.  4. Bruising and tenderness at the needle site are common side effects and will resolve in a  few days.  5. Persistent numbness or new problems with movement should be communicated to the surgeon or the  [place] [phone] [place] [phone]).  Post Anesthesia Home Care Instructions  Activity: Get plenty of rest for the remainder of the day. A responsible adult should stay with you for 24 hours following the procedure.  For the next 24 hours, DO NOT: -Drive a car -Paediatric nurse -Drink alcoholic beverages -Take any medication unless instructed by your physician -Make any legal decisions or sign important papers.  Meals: Start with liquid foods such as gelatin or soup. Progress to regular foods as tolerated. Avoid greasy, spicy, heavy foods. If nausea and/or vomiting occur, drink only clear liquids until the nausea and/or vomiting subsides. Call your physician if vomiting continues.  Special Instructions/Symptoms: Your throat may feel dry or sore from the anesthesia or the breathing tube placed in your throat during surgery. If this causes discomfort, gargle with warm salt water. The discomfort should disappear within 24 hours.  If you had a scopolamine patch placed behind your ear for the management of post- operative nausea and/or vomiting:  1. The medication in the patch is effective for 72 hours, after which it should be removed.  Wrap patch in a tissue and discard in the trash. Wash hands thoroughly with soap and water. 2. You may remove the patch earlier than 72 hours if you experience unpleasant side effects which may include dry mouth, dizziness or visual disturbances. 3. Avoid touching the patch. Wash your hands with soap and water after contact with the patch.

## 2016-02-16 NOTE — Interval H&P Note (Signed)
History and Physical Interval Note:  02/16/2016 7:17 AM  Paula Hughes  has presented today for surgery, with the diagnosis of UNILATERAL PRIMARY OSTEOARTHRITIS, LEFT KNEE  The various methods of treatment have been discussed with the patient and family. After consideration of risks, benefits and other options for treatment, the patient has consented to  Procedure(s): LEFT KNEE PATELLA-FEMORAL REPLACEMENT (Left) as a surgical intervention .  The patient's history has been reviewed, patient examined, no change in status, stable for surgery.  I have reviewed the patient's chart and labs.  Questions were answered to the patient's satisfaction.     Paula Hughes

## 2016-02-16 NOTE — Anesthesia Postprocedure Evaluation (Signed)
Anesthesia Post Note  Patient: Sarely Barrall  Procedure(s) Performed: Procedure(s) (LRB): LEFT KNEE PATELLA-FEMORAL REPLACEMENT (Left)  Patient location during evaluation: PACU Anesthesia Type: General Level of consciousness: awake and alert Pain management: pain level controlled Vital Signs Assessment: post-procedure vital signs reviewed and stable Respiratory status: spontaneous breathing, nonlabored ventilation and respiratory function stable Cardiovascular status: blood pressure returned to baseline and stable Postop Assessment: no signs of nausea or vomiting Anesthetic complications: no    Last Vitals:  Filed Vitals:   02/16/16 1145 02/16/16 1245  BP: 128/71 119/69  Pulse: 67 74  Temp:  36.5 C  Resp: 13 16    Last Pain:  Filed Vitals:   02/16/16 1302  PainSc: 0-No pain                 Kinsley Holderman A

## 2016-02-16 NOTE — Transfer of Care (Signed)
Immediate Anesthesia Transfer of Care Note  Patient: Paula Hughes  Procedure(s) Performed: Procedure(s): LEFT KNEE PATELLA-FEMORAL REPLACEMENT (Left)  Patient Location: PACU  Anesthesia Type:General  Level of Consciousness: awake and sedated  Airway & Oxygen Therapy: Patient Spontanous Breathing and Patient connected to face mask oxygen  Post-op Assessment: Report given to RN and Post -op Vital signs reviewed and stable  Post vital signs: Reviewed and stable  Last Vitals:  Filed Vitals:   02/16/16 0815 02/16/16 0830  BP: 104/69 97/56  Pulse: 93 56  Temp:    Resp: 19 12    Last Pain:  Filed Vitals:   02/16/16 0841  PainSc: 3       Patients Stated Pain Goal: 1 ([id number])  Complications: No apparent anesthesia complications

## 2016-02-16 NOTE — Progress Notes (Signed)
Assisted Dr. Crews with left, ultrasound guided, femoral block. Side rails up, monitors on throughout procedure. See vital signs in flow sheet. Tolerated Procedure well. 

## 2016-02-19 ENCOUNTER — Encounter (HOSPITAL_BASED_OUTPATIENT_CLINIC_OR_DEPARTMENT_OTHER): Payer: Self-pay | Admitting: Orthopedic Surgery

## 2016-02-20 NOTE — Op Note (Signed)
NAMEANDE, GOEWEY NO.:  [id number]  MEDICAL RECORD NO.:  [id number]  LOCATION:                                FACILITY:  [facility]  PHYSICIAN:  [name], M.D. DATE OF BIRTH:  1955-12-12  DATE OF PROCEDURE:  02/16/2016 DATE OF DISCHARGE:  02/16/2016                              OPERATIVE REPORT   PREOPERATIVE DIAGNOSIS:  Left knee end-stage arthritis, primary localized, patellofemoral joint.  POSTOPERATIVE DIAGNOSIS:  Left knee end-stage arthritis, primary localized, patellofemoral joint with some focal grade 3 changes, anterior, distal, medial femoral condyle, 1 cm diameter, not full- thickness.  PROCEDURE:  Left knee unicompartmental replacement, patellofemoral joint utilizing Arthrosurface Stryker prosthesis.  A press-fit 5 x 7 trochlear component.  A cemented pegged medial offset 35 mm patellar component. Chondroplasty of the chondral lesion medial femoral condyle.  SURGEON:  [name], MD  ASSISTANT:  Elmyra Ricks, PA, present throughout the entire case, necessary for timely completion of procedure.  ANESTHESIA:  General.  BLOOD LOSS:  Minimal.  SPECIMENS:  None.  CULTURES:  None.  COMPLICATIONS:  None.  DRESSINGS:  Soft compressive knee immobilizer.  TOURNIQUET TIME:  45 minutes.  PROCEDURE IN DETAIL:  The patient was brought to operating room, placed on operating table in supine position.  After adequate anesthesia had been obtained, tourniquet applied, prepped and draped in usual sterile fashion.  Exsanguinated with elevation of Esmarch.  Tourniquet inflated to 350 mmHg.  Straight incision above the patella down to tibial tubercle.  Medial arthrotomy, vastus splitting.  Entire knee examined. Other than 1 small focal grade 3 lesion medial femoral condyle, remaining articular cartilage, menisci cruciate ligaments intact. Extensive grade 4 changes patellofemoral joint.  Utilizing Arthrosurface instrumentation, the  trochlea was measured in both directions.  It was then reamed, drilled, and prepared for press-fit component.  PEG put in place.  Definitive component put in place 5 x 7.  Good fitting, good contouring, good interface into remaining cartilage.  Patella exposed. Posterior 10 mm removed.  Drilled, sized, and fitted for a 35-mm component.  Excellent tracking and fitting and contour between the components confirmed.  Wound irrigated.  The patellar component was then cemented, held with a clamp.  Once the cement hardened, the knee was irrigated again.  Soft tissues injected with Exparel.  Arthrotomy closed with Vicryl.  Subcutaneous and subcuticular closure.  Margins were injected with Marcaine.  Sterile compressive dressing applied.  Tourniquet deflated and removed.  Knee immobilizer applied.  Anesthesia reversed.  Brought to the recovery room.  Tolerated the surgery well, no complications.     [name], M.D.     DFM/MEDQ  D:  02/16/2016  T:  02/16/2016  Job:  [id number]

## 2016-04-26 ENCOUNTER — Telehealth: Payer: Self-pay | Admitting: Family Medicine

## 2016-04-26 NOTE — Telephone Encounter (Signed)
Having some shoulder pain since her knee surgery. We previously discussed Dr. Johnston Ebbs at [facility], who performs active release technique (as well as routine manipulations).  Gave her the phone number to his office.

## 2016-04-26 NOTE — Telephone Encounter (Signed)
Pt called and was wondering who your chiropractor  was she couldn't remember she would like to go to them. States you had given the name but she couldn't remember it, pt can be reached at [phone] (M)

## 2016-05-20 ENCOUNTER — Encounter: Payer: Self-pay | Admitting: Family Medicine

## 2016-05-21 ENCOUNTER — Other Ambulatory Visit: Payer: Self-pay

## 2016-05-21 DIAGNOSIS — N3281 Overactive bladder: Secondary | ICD-10-CM

## 2016-05-21 MED ORDER — SOLIFENACIN SUCCINATE 10 MG PO TABS: 10.0000 mg | ORAL_TABLET | Freq: Every day | ORAL | 0 refills | Status: DC

## 2016-05-21 MED ORDER — ETODOLAC 400 MG PO TABS: 400.0000 mg | ORAL_TABLET | Freq: Two times a day (BID) | ORAL | 0 refills | Status: DC

## 2016-05-28 ENCOUNTER — Encounter: Payer: Self-pay | Admitting: Family Medicine

## 2016-05-28 DIAGNOSIS — Z1211 Encounter for screening for malignant neoplasm of colon: Secondary | ICD-10-CM

## 2016-05-29 ENCOUNTER — Other Ambulatory Visit: Payer: Self-pay | Admitting: Family Medicine

## 2016-05-29 ENCOUNTER — Encounter: Payer: Self-pay | Admitting: Internal Medicine

## 2016-05-29 DIAGNOSIS — N3281 Overactive bladder: Secondary | ICD-10-CM

## 2016-05-29 NOTE — Telephone Encounter (Signed)
Is this okay to refill? 

## 2016-05-29 NOTE — Telephone Encounter (Signed)
Veronica--I put in the order.  Can you please follow up on it? Thanks

## 2016-05-29 NOTE — Telephone Encounter (Signed)
Etodolac and vesicare were both refilled for 90d supply on 8/7.  Looks like those were sent to [facility], locally, not [facility].  This request is for [facility]. See if she got those filled, if so, shouldn't need now, and I can do the ones to [facility] at her upcoming visit--She has appt 8/30.

## 2016-05-30 ENCOUNTER — Telehealth: Payer: Self-pay | Admitting: *Deleted

## 2016-05-30 ENCOUNTER — Encounter: Payer: Self-pay | Admitting: *Deleted

## 2016-05-30 NOTE — Telephone Encounter (Signed)
Called Dr.Mann/Hung's office and they do NOT do colonoscopies or any procedures on Friday as I thought.

## 2016-06-04 ENCOUNTER — Encounter: Payer: Self-pay | Admitting: Family Medicine

## 2016-06-11 ENCOUNTER — Encounter: Payer: Self-pay | Admitting: Family Medicine
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
## 2016-06-13 ENCOUNTER — Ambulatory Visit (INDEPENDENT_AMBULATORY_CARE_PROVIDER_SITE_OTHER): Payer: 59 | Admitting: Family Medicine

## 2016-06-13 ENCOUNTER — Encounter: Payer: Self-pay | Admitting: Family Medicine

## 2016-06-13 ENCOUNTER — Telehealth: Payer: Self-pay

## 2016-06-13 VITALS — BP 114/66 | HR 64 | Ht 70.0 in | Wt 188.0 lb

## 2016-06-13 DIAGNOSIS — Z0000 Encounter for general adult medical examination without abnormal findings: Secondary | ICD-10-CM | POA: Diagnosis not present

## 2016-06-13 DIAGNOSIS — N3281 Overactive bladder: Secondary | ICD-10-CM

## 2016-06-13 DIAGNOSIS — Z23 Encounter for immunization: Secondary | ICD-10-CM

## 2016-06-13 DIAGNOSIS — Z5181 Encounter for therapeutic drug level monitoring: Secondary | ICD-10-CM

## 2016-06-13 DIAGNOSIS — E042 Nontoxic multinodular goiter: Secondary | ICD-10-CM

## 2016-06-13 LAB — CBC WITH DIFFERENTIAL/PLATELET
BASOS PCT: 1 %
Basophils Absolute: 37 cells/uL (ref 0–200)
EOS ABS: 148 {cells}/uL (ref 15–500)
Eosinophils Relative: 4 %
HEMATOCRIT: 42.2 % (ref 35.0–45.0)
Hemoglobin: 14.2 g/dL (ref 11.7–15.5)
LYMPHS ABS: 1443 {cells}/uL (ref 850–3900)
Lymphocytes Relative: 39 %
MCH: 30.4 pg (ref 27.0–33.0)
MCHC: 33.6 g/dL (ref 32.0–36.0)
MCV: 90.4 fL (ref 80.0–100.0)
MONO ABS: 370 {cells}/uL (ref 200–950)
MPV: 12.1 fL (ref 7.5–12.5)
Monocytes Relative: 10 %
NEUTROS ABS: 1702 {cells}/uL (ref 1500–7800)
Neutrophils Relative %: 46 %
Platelets: 242 10*3/uL (ref 140–400)
RBC: 4.67 MIL/uL (ref 3.80–5.10)
RDW: 12.8 % (ref 11.0–15.0)
WBC: 3.7 10*3/uL — ABNORMAL LOW (ref 4.0–10.5)

## 2016-06-13 LAB — COMPREHENSIVE METABOLIC PANEL
ALBUMIN: 4.1 g/dL (ref 3.6–5.1)
ALT: 19 U/L (ref 6–29)
AST: 22 U/L (ref 10–35)
Alkaline Phosphatase: 60 U/L (ref 33–130)
BUN: 24 mg/dL (ref 7–25)
CALCIUM: 10.1 mg/dL (ref 8.6–10.4)
CHLORIDE: 106 mmol/L (ref 98–110)
CO2: 26 mmol/L (ref 20–31)
Creat: 0.76 mg/dL (ref 0.50–0.99)
Glucose, Bld: 84 mg/dL (ref 65–99)
POTASSIUM: 4.9 mmol/L (ref 3.5–5.3)
SODIUM: 140 mmol/L (ref 135–146)
Total Bilirubin: 0.9 mg/dL (ref 0.2–1.2)
Total Protein: 6.5 g/dL (ref 6.1–8.1)

## 2016-06-13 LAB — POCT URINALYSIS DIPSTICK
Blood, UA: NEGATIVE
GLUCOSE UA: NEGATIVE
Ketones, UA: NEGATIVE
LEUKOCYTES UA: NEGATIVE
NITRITE UA: NEGATIVE
PH UA: 6
Protein, UA: NEGATIVE
Spec Grav, UA: >=1.03
UROBILINOGEN UA: NEGATIVE

## 2016-06-13 NOTE — Telephone Encounter (Signed)
That's fine. Thanks!

## 2016-06-13 NOTE — Patient Instructions (Addendum)
  HEALTH MAINTENANCE RECOMMENDATIONS:  It is recommended that you get at least 30 minutes of aerobic exercise at least 5 days/week (for weight loss, you may need as much as 60-90 minutes). This can be any activity that gets your heart rate up. This can be divided in 10-15 minute intervals if needed, but try and build up your endurance at least once a week.  Weight bearing exercise is also recommended twice weekly.  Eat a healthy diet with lots of vegetables, fruits and fiber.  "Colorful" foods have a lot of vitamins (ie green vegetables, tomatoes, red peppers, etc).  Limit sweet tea, regular sodas and alcoholic beverages, all of which has a lot of calories and sugar.  Up to 1 alcoholic drink daily may be beneficial for women (unless trying to lose weight, watch sugars).  Drink a lot of water.  Calcium recommendations are 1200-1500 mg daily (1500 mg for postmenopausal women or women without ovaries), and vitamin D 1000 IU daily.  This should be obtained from diet and/or supplements (vitamins), and calcium should not be taken all at once, but in divided doses.  Monthly self breast exams and yearly mammograms for women over the age of 33 is recommended.  Sunscreen of at least SPF 30 should be used on all sun-exposed parts of the skin when outside between the hours of 10 am and 4 pm (not just when at beach or pool, but even with exercise, golf, tennis, and yard work!)  Use a sunscreen that says "broad spectrum" so it covers both UVA and UVB rays, and make sure to reapply every 1-2 hours.  Remember to change the batteries in your smoke detectors when changing your clock times in the spring and fall.  Use your seat belt every time you are in a car, and please drive safely and not be distracted with cell phones and texting while driving.  Make sure you wait one month from today before getting your flu shot (due to getting live virus shingles vaccine today).

## 2016-06-13 NOTE — Telephone Encounter (Signed)
I have scheduled pt for 11/08/2015 @ 1:45 for physical. Please check if this is ok. This put 2 physicals in the afternoon.   Thank you, Wells Guiles

## 2016-06-29 ENCOUNTER — Encounter: Payer: Self-pay | Admitting: Family Medicine

## 2016-07-05 ENCOUNTER — Encounter: Payer: Self-pay | Admitting: Family Medicine

## 2016-08-13 LAB — HM COLONOSCOPY

## 2016-08-16 ENCOUNTER — Encounter (HOSPITAL_BASED_OUTPATIENT_CLINIC_OR_DEPARTMENT_OTHER): Payer: Self-pay | Admitting: *Deleted

## 2016-08-20 ENCOUNTER — Telehealth: Payer: Self-pay | Admitting: Family Medicine

## 2016-08-20 MED ORDER — ETODOLAC 400 MG PO TABS: 400.0000 mg | ORAL_TABLET | Freq: Two times a day (BID) | ORAL | 0 refills | Status: DC

## 2016-08-20 NOTE — Telephone Encounter (Signed)
Rcvd refill request for Etodolac 400 mg  From [facility]

## 2016-08-20 NOTE — H&P (Signed)
Paula Hughes comes in for follow up of her left knee.  She is status post patellofemoral unicompartmental replacement by me on Feb 16, 2016.  At that time I felt we had a very nicely balanced patellofemoral joint.  Unfortunately she struggled with abnormal tracking.  She tends to sit out laterally in flexion.  Then pops into the groove at about 20 degrees of flexion and then stays well seated from 20 degrees to 0 degrees.  Very mechanical.  She has struggled with therapy working on strength.  This mechanical situation however persists.  Her weakness has gotten better.  She has worked hard in therapy.   History and general exam are reviewed. I have reviewed operative notes.   EXAMINATION: Specifically on her exam, when I move her passively I have full motion.  Nicely balanced patellofemoral joint and she doesn't feel like she really tethers.  Actively however she tends to sit laterally from 100 degrees of flexion to 20 degrees and then pops back into the groove and sits normally from 20-0 degrees.  There is obviously some active tethering laterally.  No meniscal signs.  Ligaments stable.  No real swelling.  Neurovascularly intact distally.  Her strength is much better.    X-RAYS: New x-ray compared to old x-rays, when she injured her knee in August, reviewed.  That x-ray showed a little lateral patellofemoral positioning.  This is not quite as marked today.    DISPOSITION:  I went through things with Curt Bears.  She fully understands that her knee is balanced passively, but actively it is not.  We need to do something to adjust this.  We talked about exam under anesthesia, arthroscopy and if possible lateral retinacular release.  I want to do this with her awake with active extension with a knee block so I am convinced that what I do will resolve things.  We will do a scope and look at all structures.  I will then probably add a lateral release and be sure that when she actively extends that it obliterates this  maltracking issue.  What is involved reviewed.  Paperwork complete.  All questions answered.  More than 25 minutes spent face-to-face covering this with her.  See her at the time of operative intervention.  I don't think further therapy or other efforts are going to improve things, as she has really forestalled.

## 2016-08-20 NOTE — Telephone Encounter (Signed)
Is this okay?

## 2016-08-23 ENCOUNTER — Ambulatory Visit (HOSPITAL_BASED_OUTPATIENT_CLINIC_OR_DEPARTMENT_OTHER)
Admission: RE | Admit: 2016-08-23 | Discharge: 2016-08-23 | Disposition: A | Discharge status: Home / Self Care | Payer: 59 | Source: Ambulatory Visit | Attending: Orthopedic Surgery | Admitting: Orthopedic Surgery

## 2016-08-23 ENCOUNTER — Encounter (HOSPITAL_BASED_OUTPATIENT_CLINIC_OR_DEPARTMENT_OTHER): Payer: Self-pay | Admitting: Anesthesiology

## 2016-08-23 ENCOUNTER — Ambulatory Visit (HOSPITAL_BASED_OUTPATIENT_CLINIC_OR_DEPARTMENT_OTHER): Payer: 59 | Admitting: Anesthesiology

## 2016-08-23 ENCOUNTER — Encounter (HOSPITAL_BASED_OUTPATIENT_CLINIC_OR_DEPARTMENT_OTHER)
Admission: RE | Disposition: A | Discharge status: Home / Self Care | Payer: Self-pay | Source: Ambulatory Visit | Attending: Orthopedic Surgery

## 2016-08-23 DIAGNOSIS — Z96652 Presence of left artificial knee joint: Secondary | ICD-10-CM | POA: Insufficient documentation

## 2016-08-23 DIAGNOSIS — M228X2 Other disorders of patella, left knee: Secondary | ICD-10-CM | POA: Insufficient documentation

## 2016-08-23 DIAGNOSIS — Z79899 Other long term (current) drug therapy: Secondary | ICD-10-CM | POA: Diagnosis not present

## 2016-08-23 DIAGNOSIS — K219 Gastro-esophageal reflux disease without esophagitis: Secondary | ICD-10-CM | POA: Diagnosis not present

## 2016-08-23 DIAGNOSIS — Z79890 Hormone replacement therapy (postmenopausal): Secondary | ICD-10-CM | POA: Insufficient documentation

## 2016-08-23 HISTORY — PX: KNEE ARTHROSCOPY WITH LATERAL RELEASE: SHX5649

## 2016-08-23 SURGERY — ARTHROSCOPY, KNEE, WITH LATERAL RETINACULUM RELEASE
Anesthesia: Monitor Anesthesia Care | Site: Knee | Laterality: Left

## 2016-08-23 MED ORDER — CEFAZOLIN SODIUM-DEXTROSE 2-4 GM/100ML-% IV SOLN
2.0000 g | INTRAVENOUS | Status: AC
  Administered 2016-08-23: 2 g via INTRAVENOUS

## 2016-08-23 MED ORDER — PROPOFOL 10 MG/ML IV BOLUS
INTRAVENOUS | Status: DC | PRN
  Administered 2016-08-23 (×3): 20 mg via INTRAVENOUS

## 2016-08-23 MED ORDER — BUPIVACAINE-EPINEPHRINE (PF) 0.5% -1:200000 IJ SOLN
INTRAMUSCULAR | Status: DC | PRN
  Administered 2016-08-23: 30 mL via PERINEURAL

## 2016-08-23 MED ORDER — CEFAZOLIN SODIUM-DEXTROSE 2-4 GM/100ML-% IV SOLN
INTRAVENOUS | Status: AC
Start: 2016-08-23 — End: 2016-08-23
  Filled 2016-08-23: qty 100

## 2016-08-23 MED ORDER — HYDROCODONE-ACETAMINOPHEN 5-325 MG PO TABS: 1.0000 | ORAL_TABLET | ORAL | 0 refills | Status: DC | PRN

## 2016-08-23 MED ORDER — FENTANYL CITRATE (PF) 100 MCG/2ML IJ SOLN
INTRAMUSCULAR | Status: AC
  Filled 2016-08-23: qty 2

## 2016-08-23 MED ORDER — LACTATED RINGERS IV SOLN
INTRAVENOUS | Status: DC
  Administered 2016-08-23: 10 mL/h via INTRAVENOUS

## 2016-08-23 MED ORDER — OXYCODONE HCL 5 MG PO TABS
5.0000 mg | ORAL_TABLET | Freq: Once | ORAL | Status: AC | PRN
  Administered 2016-08-23: 5 mg via ORAL

## 2016-08-23 MED ORDER — BUPIVACAINE HCL (PF) 0.5 % IJ SOLN
INTRAMUSCULAR | Status: DC | PRN
  Administered 2016-08-23: 19 mL via INTRA_ARTICULAR

## 2016-08-23 MED ORDER — MEPERIDINE HCL 25 MG/ML IJ SOLN: 6.2500 mg | INTRAMUSCULAR | Status: DC | PRN

## 2016-08-23 MED ORDER — BUPIVACAINE HCL (PF) 0.25 % IJ SOLN
INTRAMUSCULAR | Status: AC
  Filled 2016-08-23: qty 30

## 2016-08-23 MED ORDER — CHLORHEXIDINE GLUCONATE 4 % EX LIQD: 60.0000 mL | Freq: Once | CUTANEOUS | Status: DC

## 2016-08-23 MED ORDER — BUPIVACAINE HCL (PF) 0.5 % IJ SOLN
INTRAMUSCULAR | Status: AC
  Filled 2016-08-23: qty 30

## 2016-08-23 MED ORDER — SCOPOLAMINE 1 MG/3DAYS TD PT72: 1.0000 | MEDICATED_PATCH | Freq: Once | TRANSDERMAL | Status: DC | PRN

## 2016-08-23 MED ORDER — PROPOFOL 500 MG/50ML IV EMUL
INTRAVENOUS | Status: AC
  Filled 2016-08-23: qty 50

## 2016-08-23 MED ORDER — MIDAZOLAM HCL 2 MG/2ML IJ SOLN: 1.0000 mg | INTRAMUSCULAR | Status: DC | PRN

## 2016-08-23 MED ORDER — HYDROMORPHONE HCL 1 MG/ML IJ SOLN
INTRAMUSCULAR | Status: AC
  Filled 2016-08-23: qty 1

## 2016-08-23 MED ORDER — METHYLPREDNISOLONE ACETATE 80 MG/ML IJ SUSP
INTRAMUSCULAR | Status: DC | PRN
  Administered 2016-08-23: 08:00:00 via INTRA_ARTICULAR

## 2016-08-23 MED ORDER — ONDANSETRON HCL 4 MG PO TABS: 4.0000 mg | ORAL_TABLET | Freq: Three times a day (TID) | ORAL | 0 refills | Status: DC | PRN

## 2016-08-23 MED ORDER — FENTANYL CITRATE (PF) 100 MCG/2ML IJ SOLN
50.0000 ug | INTRAMUSCULAR | Status: DC | PRN
  Administered 2016-08-23: 100 ug via INTRAVENOUS
  Administered 2016-08-23: 50 ug via INTRAVENOUS

## 2016-08-23 MED ORDER — HYDROMORPHONE HCL 1 MG/ML IJ SOLN
0.2500 mg | INTRAMUSCULAR | Status: DC | PRN
  Administered 2016-08-23: 0.5 mg via INTRAVENOUS

## 2016-08-23 MED ORDER — OXYCODONE HCL 5 MG/5ML PO SOLN: 5.0000 mg | Freq: Once | ORAL | Status: AC | PRN

## 2016-08-23 MED ORDER — PROMETHAZINE HCL 25 MG/ML IJ SOLN: 6.2500 mg | INTRAMUSCULAR | Status: DC | PRN

## 2016-08-23 MED ORDER — SODIUM CHLORIDE 0.9 % IR SOLN
Status: DC | PRN
  Administered 2016-08-23: 3000 mL

## 2016-08-23 MED ORDER — MIDAZOLAM HCL 2 MG/2ML IJ SOLN
INTRAMUSCULAR | Status: AC
  Filled 2016-08-23: qty 2

## 2016-08-23 MED ORDER — LIDOCAINE 2% (20 MG/ML) 5 ML SYRINGE
INTRAMUSCULAR | Status: AC
  Filled 2016-08-23: qty 5

## 2016-08-23 MED ORDER — OXYCODONE HCL 5 MG PO TABS
ORAL_TABLET | ORAL | Status: AC
  Filled 2016-08-23: qty 1

## 2016-08-23 MED ORDER — LIDOCAINE HCL (CARDIAC) 20 MG/ML IV SOLN
INTRAVENOUS | Status: DC | PRN
  Administered 2016-08-23: 20 mg via INTRAVENOUS

## 2016-08-23 MED ORDER — LACTATED RINGERS IV SOLN: INTRAVENOUS | Status: DC

## 2016-08-23 MED ORDER — METHYLPREDNISOLONE ACETATE 80 MG/ML IJ SUSP
INTRAMUSCULAR | Status: AC
  Filled 2016-08-23: qty 2

## 2016-08-23 SURGICAL SUPPLY — 40 items
BANDAGE ACE 6X5 VEL STRL LF (GAUZE/BANDAGES/DRESSINGS) ×3 IMPLANT
BLADE CUDA 5.5 (BLADE) IMPLANT
BLADE CUDA GRT WHITE 3.5 (BLADE) IMPLANT
BLADE CUTTER GATOR 3.5 (BLADE) ×3 IMPLANT
BLADE CUTTER MENIS 5.5 (BLADE) IMPLANT
BLADE GREAT WHITE 4.2 (BLADE) ×2 IMPLANT
BLADE GREAT WHITE 4.2MM (BLADE) ×1
BUR OVAL 4.0 (BURR) IMPLANT
CUTTER MENISCUS  4.2MM (BLADE)
CUTTER MENISCUS 4.2MM (BLADE) IMPLANT
DECANTER SPIKE VIAL GLASS SM (MISCELLANEOUS) ×2 IMPLANT
DRAPE ARTHROSCOPY W/POUCH 90 (DRAPES) ×3 IMPLANT
DURAPREP 26ML APPLICATOR (WOUND CARE) ×3 IMPLANT
ELECT MENISCUS 165MM 90D (ELECTRODE) ×2 IMPLANT
ELECT REM PT RETURN 9FT ADLT (ELECTROSURGICAL) ×3
ELECTRODE REM PT RTRN 9FT ADLT (ELECTROSURGICAL) IMPLANT
GAUZE SPONGE 4X4 12PLY STRL (GAUZE/BANDAGES/DRESSINGS) ×6 IMPLANT
GAUZE XEROFORM 1X8 LF (GAUZE/BANDAGES/DRESSINGS) ×3 IMPLANT
GLOVE BIOGEL PI IND STRL 7.0 (GLOVE) ×1 IMPLANT
GLOVE BIOGEL PI INDICATOR 7.0 (GLOVE) ×6
GLOVE ECLIPSE 6.5 STRL STRAW (GLOVE) ×2 IMPLANT
GLOVE ECLIPSE 7.0 STRL STRAW (GLOVE) ×1 IMPLANT
GLOVE SURG ORTHO 8.0 STRL STRW (GLOVE) ×3 IMPLANT
GOWN STRL REUS W/ TWL LRG LVL3 (GOWN DISPOSABLE) ×1 IMPLANT
GOWN STRL REUS W/ TWL XL LVL3 (GOWN DISPOSABLE) ×2 IMPLANT
GOWN STRL REUS W/TWL LRG LVL3 (GOWN DISPOSABLE) ×3
GOWN STRL REUS W/TWL XL LVL3 (GOWN DISPOSABLE) ×6
HOLDER KNEE FOAM BLUE (MISCELLANEOUS) ×3 IMPLANT
IV NS IRRIG 3000ML ARTHROMATIC (IV SOLUTION) ×8 IMPLANT
KNEE WRAP E Z 3 GEL PACK (MISCELLANEOUS) ×2 IMPLANT
MANIFOLD NEPTUNE II (INSTRUMENTS) ×3 IMPLANT
PACK ARTHROSCOPY DSU (CUSTOM PROCEDURE TRAY) ×3 IMPLANT
PACK BASIN DAY SURGERY FS (CUSTOM PROCEDURE TRAY) ×3 IMPLANT
PENCIL BUTTON HOLSTER BLD 10FT (ELECTRODE) ×2 IMPLANT
SET ARTHROSCOPY TUBING (MISCELLANEOUS) ×3
SET ARTHROSCOPY TUBING LN (MISCELLANEOUS) ×1 IMPLANT
SUT ETHILON 3 0 PS 1 (SUTURE) ×3 IMPLANT
SUT VIC AB 3-0 FS2 27 (SUTURE) IMPLANT
TOWEL OR 17X24 6PK STRL BLUE (TOWEL DISPOSABLE) ×3 IMPLANT
WATER STERILE IRR 1000ML POUR (IV SOLUTION) ×3 IMPLANT

## 2016-08-23 NOTE — Interval H&P Note (Signed)
History and Physical Interval Note:  08/23/2016 7:31 AM  Paula Hughes  has presented today for surgery, with the diagnosis of OA left knee  The various methods of treatment have been discussed with the patient and family. After consideration of risks, benefits and other options for treatment, the patient has consented to  Procedure(s) with comments: KNEE ARTHROSCOPY WITH LATERAL RELEASE, left (Left) - No sedation patient needs to be awake as a surgical intervention .  The patient's history has been reviewed, patient examined, no change in status, stable for surgery.  I have reviewed the patient's chart and labs.  Questions were answered to the patient's satisfaction.     [name]

## 2016-08-23 NOTE — Transfer of Care (Signed)
Immediate Anesthesia Transfer of Care Note  Patient: Paula Hughes  Procedure(s) Performed: Procedure(s) with comments: KNEE ARTHROSCOPY WITH LATERAL RELEASE, left (Left) - No sedation patient needs to be awake  Patient Location: PACU  Anesthesia Type:MAC  Level of Consciousness: awake, alert  and oriented  Airway & Oxygen Therapy: Patient Spontanous Breathing  Post-op Assessment: Report given to RN and Post -op Vital signs reviewed and stable  Post vital signs: Reviewed and stable  Last Vitals:  Vitals:   08/23/16 0710 08/23/16 0715  BP:    Pulse: (!) 54 72  Resp: 12 (!) 21  Temp:      Last Pain:  Vitals:   08/23/16 0654  TempSrc: Oral         Complications: No apparent anesthesia complications

## 2016-08-23 NOTE — Anesthesia Postprocedure Evaluation (Signed)
Anesthesia Post Note  Patient: Paula Hughes  Procedure(s) Performed: Procedure(s) (LRB): KNEE ARTHROSCOPY WITH LATERAL RELEASE, left (Left)  Patient location during evaluation: PACU Anesthesia Type: MAC and Regional Level of consciousness: awake and alert Pain management: pain level controlled Vital Signs Assessment: post-procedure vital signs reviewed and stable Respiratory status: spontaneous breathing Cardiovascular status: stable Anesthetic complications: no    Last Vitals:  Vitals:   08/23/16 0900 08/23/16 0945  BP: 115/75 131/74  Pulse: 79 72  Resp: 14 16  Temp:  36.5 C    Last Pain:  Vitals:   08/23/16 0930  TempSrc:   PainSc: [place]

## 2016-08-23 NOTE — Anesthesia Preprocedure Evaluation (Signed)
Anesthesia Evaluation  Patient identified by MRN, date of birth, ID band Patient awake    Reviewed: Allergy & Precautions, NPO status , Patient's Chart, lab work & pertinent test results  History of Anesthesia Complications (+) PONV  Airway Mallampati: I  TM Distance: >3 FB Neck ROM: Full    Dental  (+) Teeth Intact, Dental Advisory Given   Pulmonary neg pulmonary ROS,    breath sounds clear to auscultation       Cardiovascular negative cardio ROS   Rhythm:Regular Rate:Normal     Neuro/Psych negative neurological ROS  negative psych ROS   GI/Hepatic negative GI ROS, Neg liver ROS, GERD  Medicated and Controlled,  Endo/Other  negative endocrine ROS  Renal/GU Renal disease     Musculoskeletal  (+) Arthritis ,   Abdominal   Peds  Hematology negative hematology ROS (+)   Anesthesia Other Findings   Reproductive/Obstetrics negative OB ROS                             Anesthesia Physical  Anesthesia Plan  ASA: II  Anesthesia Plan: General   Post-op Pain Management: GA combined w/ Regional for post-op pain   Induction: Intravenous  Airway Management Planned: LMA  Additional Equipment:   Intra-op Plan:   Post-operative Plan: Extubation in OR  Informed Consent: I have reviewed the patients History and Physical, chart, labs and discussed the procedure including the risks, benefits and alternatives for the proposed anesthesia with the patient or authorized representative who has indicated his/her understanding and acceptance.   Dental advisory given  Plan Discussed with: CRNA, Anesthesiologist and Surgeon  Anesthesia Plan Comments:         Anesthesia Quick Evaluation

## 2016-08-23 NOTE — Addendum Note (Signed)
Addendum  created 08/23/16 1609 by Nolon Nations, MD   SmartForm saved

## 2016-08-23 NOTE — Discharge Instructions (Signed)
Discharge Instructions after Knee Arthroscopy   You will have a light dressing on your knee.  Leave the dressing in place until the third day after your surgery and then remove it and place a band-aid over the stitches.  After the bandage has been removed you may shower, but do not soak the incision. You may begin gentle motion of your leg immediately after surgery. Pump your foot up and down 20 times per hour, every hour you are awake.  Apply ice to the knee 3 times per day for 30 minutes for the first 1 week until your knee is feeling comfortable again. Do not use heat.  You may begin straight leg raising exercises (if you have a brace with it on). While lying down, pull your foot all the way up, tighten your quadriceps muscle and lift your heel off of the ground. Hold this position for 2 seconds, and then let the leg back down. Repeat the exercise 10 times, at least 3 times a day.  Pain medicine has been prescribed for you.  Use your medicine as needed over the first 48 hours, and then you can begin to taper your use. You may take Extra Strength Tylenol or Tylenol only in place of the pain pills.    Please call [phone] for any problems. Including the following:  - excessive redness of the incisions - drainage for more than 4 days - fever of more than 101.5 F  *Please note that pain medications will not be refilled after hours or on weekends.   Post Anesthesia Home Care Instructions  Activity: Get plenty of rest for the remainder of the day. A responsible adult should stay with you for 24 hours following the procedure.  For the next 24 hours, DO NOT: -Drive a car -Paediatric nurse -Drink alcoholic beverages -Take any medication unless instructed by your physician -Make any legal decisions or sign important papers.  Meals: Start with liquid foods such as gelatin or soup. Progress to regular foods as tolerated. Avoid greasy, spicy, heavy foods. If nausea and/or vomiting  occur, drink only clear liquids until the nausea and/or vomiting subsides. Call your physician if vomiting continues.  Special Instructions/Symptoms: Your throat may feel dry or sore from the anesthesia or the breathing tube placed in your throat during surgery. If this causes discomfort, gargle with warm salt water. The discomfort should disappear within 24 hours.  If you had a scopolamine patch placed behind your ear for the management of post- operative nausea and/or vomiting:  1. The medication in the patch is effective for 72 hours, after which it should be removed.  Wrap patch in a tissue and discard in the trash. Wash hands thoroughly with soap and water. 2. You may remove the patch earlier than 72 hours if you experience unpleasant side effects which may include dry mouth, dizziness or visual disturbances. 3. Avoid touching the patch. Wash your hands with soap and water after contact with the patch.   Regional Anesthesia Blocks  1. Numbness or the inability to move the "blocked" extremity may last from 3-48 hours after placement. The length of time depends on the medication injected and your individual response to the medication. If the numbness is not going away after 48 hours, call your surgeon.  2. The extremity that is blocked will need to be protected until the numbness is gone and the  Strength has returned. Because you cannot feel it, you will need to take extra care to avoid injury. Because  it may be weak, you may have difficulty moving it or using it. You may not know what position it is in without looking at it while the block is in effect.  3. For blocks in the legs and feet, returning to weight bearing and walking needs to be done carefully. You will need to wait until the numbness is entirely gone and the strength has returned. You should be able to move your leg and foot normally before you try and bear weight or walk. You will need someone to be with you when you first try to  ensure you do not fall and possibly risk injury.  4. Bruising and tenderness at the needle site are common side effects and will resolve in a few days.  5. Persistent numbness or new problems with movement should be communicated to the surgeon or the [place] [phone] [place] [phone]).

## 2016-08-23 NOTE — Progress Notes (Signed)
AssistedDr. Germeroth with left, ultrasound guided, adductor canal block. Side rails up, monitors on throughout procedure. See vital signs in flow sheet. Tolerated Procedure well.  

## 2016-08-23 NOTE — Anesthesia Procedure Notes (Signed)
Anesthesia Regional Block:  Adductor canal block  Pre-Anesthetic Checklist: ,, timeout performed, Correct Patient, Correct Site, Correct Laterality, Correct Procedure, Correct Position, site marked, Risks and benefits discussed,  Surgical consent,  Pre-op evaluation,  At surgeon's request and post-op pain management  Laterality: Left  Prep: chloraprep       Needles:  Injection technique: Single-shot  Needle Type: Stimiplex     Needle Length: 9cm 9 cm Needle Gauge: 21 and 21 G    Additional Needles:  Procedures: ultrasound guided (picture in chart) Adductor canal block Narrative:  Injection made incrementally with aspirations every 5 mL.  Performed by: Personally  Anesthesiologist: Paula Hughes  Additional Notes: BP cuff, EKG monitors applied. Sedation begun. Artery and nerve location verified with U/S and anesthetic injected incrementally, slowly, and after negative aspirations under direct u/s guidance. Good fascial /perineural spread. Tolerated well.

## 2016-08-23 NOTE — Anesthesia Procedure Notes (Signed)
Performed by: Terrance Mass Oxygen Delivery Method: Simple face mask

## 2016-08-24 ENCOUNTER — Encounter (HOSPITAL_BASED_OUTPATIENT_CLINIC_OR_DEPARTMENT_OTHER): Payer: Self-pay | Admitting: Orthopedic Surgery

## 2016-08-27 NOTE — Op Note (Signed)
NAMEZARAI, MALONY NO.:  [id number]  MEDICAL RECORD NO.:  [id number]  LOCATION:                                 FACILITY:  PHYSICIAN:  [name], M.D. DATE OF BIRTH:  1956/02/14  DATE OF PROCEDURE:  08/23/2016 DATE OF DISCHARGE:                              OPERATIVE REPORT   PREOPERATIVE DIAGNOSES:  Left knee status post unicompartmental replacement, patellofemoral joint.  Increasing lateral tracking tethering.  POSTOPERATIVE DIAGNOSES:  Left knee status post unicompartmental replacement, patellofemoral joint.  Increasing lateral tracking tethering.  Some grade 2 and mild grade 3 chondral changes, medial femoral condyle and lateral tibial plateau.  Definite lateral tethering.  PROCEDURE:  Left knee exam under anesthesia, arthroscopy. Chondroplasty, medial and lateral compartments.  Arthroscopic lateral retinacular release.  SURGEON:  [name], M.D.  ASSISTANT:  Elmyra Ricks, P.A.  ANESTHESIA:  Adductor block local and sedation.  SPECIMENS:  None.  CULTURES:  None.  COMPLICATIONS:  None.  DRESSINGS:  Soft compressive.  TOURNIQUET:  Not employed.  DESCRIPTION OF PROCEDURE:  The patient was brought to the operating room and after adequate anesthesia had been obtained, leg holder applied. Leg prepped and draped in usual sterile fashion.  Two portals, one each medial and lateral parapatellar.  Arthroscope was introduced.  Knee distended and inspected.  She had definitely gotten tethering throughout range of motion.  Full extension is bad, but once she gets past 20 degrees, relatively profound.  Some grade 2 and mild grade 3 changes on the medial femoral condyle, grade 2 lateral tibial plateau debrided.  No meniscal tears.  ACL intact.  I then did a lateral release with cautery from the vastus lateralis superiorly down the lateral joint line inferiorly.  This markedly improved her tracking and with her actively extending her  knee, the tendon __________ laterally obliterated.  Once she had enough fluid in her knee, she actually __________ a little bit medial, but I was very confident the lateral release was not over done and not up into the lateral vastus tendon attachment as I stated very posterior. Instruments were fully removed.  Portals were closed with nylon. Sterile compressive dressing applied.  Anesthesia reversed.  Brought to the recovery room.  Tolerated the surgery well.  No complications.     [name], M.D.     DFM/MEDQ  D:  08/23/2016  T:  08/24/2016  Job:  [id number]

## 2016-10-11 ENCOUNTER — Encounter: Payer: Self-pay | Admitting: Medical

## 2016-10-11 ENCOUNTER — Ambulatory Visit (INDEPENDENT_AMBULATORY_CARE_PROVIDER_SITE_OTHER): Payer: 59 | Admitting: Medical

## 2016-10-11 VITALS — BP 112/70 | HR 70 | Wt 198.0 lb

## 2016-10-11 DIAGNOSIS — J029 Acute pharyngitis, unspecified: Secondary | ICD-10-CM | POA: Diagnosis not present

## 2016-10-11 LAB — POCT RAPID STREP A (OFFICE): RAPID STREP A SCREEN: NEGATIVE

## 2016-10-11 MED ORDER — AMOXICILLIN 500 MG PO CAPS: 500.0000 mg | ORAL_CAPSULE | Freq: Three times a day (TID) | ORAL | 0 refills | Status: DC
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
## 2016-10-23 ENCOUNTER — Encounter: Payer: Self-pay | Admitting: Family Medicine
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
## 2016-11-07 ENCOUNTER — Ambulatory Visit (INDEPENDENT_AMBULATORY_CARE_PROVIDER_SITE_OTHER): Payer: 59 | Admitting: Family Medicine

## 2016-11-07 ENCOUNTER — Encounter: Payer: Self-pay | Admitting: Family Medicine

## 2016-11-07 VITALS — BP 118/76 | HR 61 | Ht 70.0 in | Wt 198.0 lb

## 2016-11-07 DIAGNOSIS — Z0000 Encounter for general adult medical examination without abnormal findings: Secondary | ICD-10-CM | POA: Diagnosis not present

## 2016-11-07 DIAGNOSIS — Z5181 Encounter for therapeutic drug level monitoring: Secondary | ICD-10-CM | POA: Diagnosis not present

## 2016-11-07 DIAGNOSIS — E042 Nontoxic multinodular goiter: Secondary | ICD-10-CM

## 2016-11-07 DIAGNOSIS — N3281 Overactive bladder: Secondary | ICD-10-CM | POA: Diagnosis not present

## 2016-11-07 DIAGNOSIS — M545 Low back pain, unspecified: Secondary | ICD-10-CM

## 2016-11-07 DIAGNOSIS — M25562 Pain in left knee: Secondary | ICD-10-CM

## 2016-11-07 DIAGNOSIS — G8929 Other chronic pain: Secondary | ICD-10-CM

## 2016-11-07 LAB — POCT URINALYSIS DIPSTICK
GLUCOSE UA: NEGATIVE
Ketones, UA: NEGATIVE
Leukocytes, UA: NEGATIVE
Nitrite, UA: NEGATIVE
Protein, UA: NEGATIVE
RBC UA: NEGATIVE
UROBILINOGEN UA: NEGATIVE
pH, UA: 6

## 2016-11-07 LAB — COMPREHENSIVE METABOLIC PANEL
ALT: 19 U/L (ref 6–29)
AST: 24 U/L (ref 10–35)
Albumin: 3.8 g/dL (ref 3.6–5.1)
Alkaline Phosphatase: 59 U/L (ref 33–130)
BUN: 23 mg/dL (ref 7–25)
CO2: 25 mmol/L (ref 20–31)
Calcium: 9.8 mg/dL (ref 8.6–10.4)
Chloride: 107 mmol/L (ref 98–110)
Creat: 0.8 mg/dL (ref 0.50–0.99)
Glucose, Bld: 94 mg/dL (ref 65–99)
Potassium: 4.3 mmol/L (ref 3.5–5.3)
Sodium: 140 mmol/L (ref 135–146)
TOTAL PROTEIN: 6.2 g/dL (ref 6.1–8.1)
Total Bilirubin: 0.8 mg/dL (ref 0.2–1.2)

## 2016-11-07 LAB — LIPID PANEL
CHOLESTEROL: 180 mg/dL (ref <200)
HDL: 72 mg/dL (ref >50)
LDL Cholesterol: 97 mg/dL (ref <100)
TRIGLYCERIDES: 53 mg/dL (ref <150)
Total CHOL/HDL Ratio: 2.5 Ratio (ref <5.0)
VLDL: 11 mg/dL (ref <30)

## 2016-11-07 LAB — TSH: TSH: 0.95 m[IU]/L

## 2016-11-07 MED ORDER — SOLIFENACIN SUCCINATE 10 MG PO TABS: 10.0000 mg | ORAL_TABLET | Freq: Every day | ORAL | 3 refills | Status: DC

## 2016-11-07 MED ORDER — ETODOLAC 400 MG PO TABS
400.0000 mg | ORAL_TABLET | Freq: Two times a day (BID) | ORAL | 1 refills | Status: DC
Start: 2016-11-07 — End: 2017-05-06

## 2016-11-07 NOTE — Patient Instructions (Signed)
  HEALTH MAINTENANCE RECOMMENDATIONS:  It is recommended that you get at least 30 minutes of aerobic exercise at least 5 days/week (for weight loss, you may need as much as 60-90 minutes). This can be any activity that gets your heart rate up. This can be divided in 10-15 minute intervals if needed, but try and build up your endurance at least once a week.  Weight bearing exercise is also recommended twice weekly.  Eat a healthy diet with lots of vegetables, fruits and fiber.  "Colorful" foods have a lot of vitamins (ie green vegetables, tomatoes, red peppers, etc).  Limit sweet tea, regular sodas and alcoholic beverages, all of which has a lot of calories and sugar.  Up to 1 alcoholic drink daily may be beneficial for women (unless trying to lose weight, watch sugars).  Drink a lot of water.  Calcium recommendations are 1200-1500 mg daily (1500 mg for postmenopausal women or women without ovaries), and vitamin D 1000 IU daily.  This should be obtained from diet and/or supplements (vitamins), and calcium should not be taken all at once, but in divided doses.  Monthly self breast exams and yearly mammograms for women over the age of 52 is recommended.  Sunscreen of at least SPF 30 should be used on all sun-exposed parts of the skin when outside between the hours of 10 am and 4 pm (not just when at beach or pool, but even with exercise, golf, tennis, and yard work!)  Use a sunscreen that says "broad spectrum" so it covers both UVA and UVB rays, and make sure to reapply every 1-2 hours.  Remember to change the batteries in your smoke detectors when changing your clock times in the spring and fall.  Use your seat belt every time you are in a car, and please drive safely and not be distracted with cell phones and texting while driving.  I recommend getting the new shingles vaccine (Shingrix) when available. You will need to check with your insurance to see if it is covered.  It is a series of 2  injections, spaced 2 months apart.  You are being given a prescription to get labs done at a lab near your home in 6 months.  Be sure they fax the results to [place].

## 2016-11-12 ENCOUNTER — Encounter: Payer: Self-pay | Admitting: Family Medicine

## 2016-12-04 ENCOUNTER — Encounter: Payer: Self-pay | Admitting: Family Medicine

## 2017-04-07 ENCOUNTER — Encounter: Payer: Self-pay | Admitting: Family Medicine

## 2017-04-15 ENCOUNTER — Telehealth: Payer: Self-pay | Admitting: Medical

## 2017-04-15 NOTE — Telephone Encounter (Signed)
pls call.  She sees Dr. Tomi Bamberger.   We received a lab panel from [facility].   Her potassium and calcium were a little high.  Looking back, she hasn't had elevated potassium or calcium prior.  Is she taking any extra potassium OTC?  Has she had this issue before?  Was this a routine lab panel or was this lab done for a specific concern.

## 2017-04-15 NOTE — Telephone Encounter (Signed)
Called pt she said that she isn't taking any otc potassium and hasn't had this issue in the past. She wants the the level and what do you recommend for her

## 2017-04-16 ENCOUNTER — Encounter: Payer: Self-pay | Admitting: Family Medicine

## 2017-04-16 NOTE — Telephone Encounter (Signed)
FYI   I spoke to patient about her recent unusual potassium elevation on recent routine lab.  She has been eating a LOT of blueberries, has blueberries on her property, been eating them by the "gallon."  She also said the day at the lab was chaotic, had to wait a long time to get drawn, the place seemed to be backed up.   Thus, hemolysis of sample is a possibility.  She has no hyperkalemia symptoms.    She has surgery coming up soon and will having labs 2 weeks prior.   We will just plan to review those labs in a few weeks.

## 2017-04-26 ENCOUNTER — Encounter: Payer: Self-pay | Admitting: Medical

## 2017-05-06 ENCOUNTER — Other Ambulatory Visit: Payer: Self-pay | Admitting: Family Medicine

## 2017-05-06 DIAGNOSIS — G8929 Other chronic pain: Secondary | ICD-10-CM

## 2017-05-06 DIAGNOSIS — M545 Low back pain, unspecified: Secondary | ICD-10-CM

## 2017-05-06 DIAGNOSIS — M25562 Pain in left knee: Secondary | ICD-10-CM

## 2017-05-06 NOTE — Telephone Encounter (Signed)
Is this okay to refill? 

## 2017-05-14 HISTORY — PX: TOTAL KNEE REVISION: SHX996

## 2017-05-15 ENCOUNTER — Encounter: Payer: Self-pay | Admitting: Family Medicine

## 2017-07-03 ENCOUNTER — Encounter: Payer: Self-pay | Admitting: Family Medicine

## 2017-07-03 NOTE — Telephone Encounter (Signed)
Diana--sending this to you to see if you heard of the [facility] she was talking about.  Sending it to [place] to be on the lookout for the [employer] do it before she loses her insurance.  Thanks!

## 2017-07-10 ENCOUNTER — Encounter: Payer: Self-pay | Admitting: *Deleted

## 2017-08-10 ENCOUNTER — Telehealth: Payer: 59 | Admitting: Nurse Practitioner

## 2017-08-10 DIAGNOSIS — N3000 Acute cystitis without hematuria: Secondary | ICD-10-CM

## 2017-08-10 MED ORDER — NITROFURANTOIN MONOHYD MACRO 100 MG PO CAPS: 100.0000 mg | ORAL_CAPSULE | Freq: Two times a day (BID) | ORAL | 0 refills | Status: DC

## 2017-08-10 NOTE — Progress Notes (Signed)

## 2017-09-03 ENCOUNTER — Telehealth: Payer: 59 | Admitting: Family

## 2017-09-03 DIAGNOSIS — R300 Dysuria: Secondary | ICD-10-CM

## 2017-09-03 NOTE — Progress Notes (Signed)
Based on what you shared with me it looks like you have a serious condition that should be evaluated in a face to face office visit.  NOTE: Even if you have entered your credit card information for this eVisit, you will not be charged.   If you are having a true medical emergency please call 911.  If you need an urgent face to face visit, [place] [facility] has four urgent care centers for your convenience.  If you need care fast and have a high deductible or no insurance consider:   [email or web address]  [phone]  [address], [address] [place], [place] [address] 8 am to 8 pm Monday-Friday 10 am to 4 pm Saturday-Sunday   The following sites will take your  insurance:    . [facility]  [phone] Get Driving Directions Find a Provider at this Location  [address] [place], [place] [address] . 10 am to 8 pm Monday-Friday . 12 pm to 8 pm Saturday-Sunday   . [facility]  [phone] Get Driving Directions Find a Provider at this Location  [address], [address] [place], [place] [address] . 8 am to 8 pm Monday-Friday . 9 am to 6 pm Saturday . 11 am to 6 pm Sunday   . [facility]  [phone] Get Driving Directions  [address] [place], [place] [address] . 8 am to 8 pm Monday-Friday . 8 am to 4 pm Saturday-Sunday   Your e-visit answers were reviewed by a board certified advanced clinical practitioner to complete your personal care plan.  Thank you for using e-Visits.  

## 2017-09-11 ENCOUNTER — Encounter: Payer: Self-pay | Admitting: Family Medicine

## 2017-09-17 ENCOUNTER — Encounter: Payer: Self-pay | Admitting: Family Medicine

## 2017-09-26 ENCOUNTER — Ambulatory Visit (INDEPENDENT_AMBULATORY_CARE_PROVIDER_SITE_OTHER): Payer: 59 | Admitting: Family Medicine

## 2017-09-26 VITALS — BP 110/60 | Temp 98.1°F | Wt 192.6 lb

## 2017-09-26 DIAGNOSIS — N3000 Acute cystitis without hematuria: Secondary | ICD-10-CM

## 2017-09-26 DIAGNOSIS — R350 Frequency of micturition: Secondary | ICD-10-CM

## 2017-09-26 LAB — POCT URINALYSIS DIP (PROADVANTAGE DEVICE)
GLUCOSE UA: NEGATIVE mg/dL
Ketones, POC UA: NEGATIVE mg/dL
Leukocytes, UA: NEGATIVE
Nitrite, UA: NEGATIVE
Protein Ur, POC: NEGATIVE mg/dL
RBC UA: NEGATIVE
SPECIFIC GRAVITY, URINE: 1.025
UUROB: NEGATIVE
pH, UA: 6 (ref 5.0–8.0)

## 2017-09-26 MED ORDER — CIPROFLOXACIN HCL 500 MG PO TABS: 500.0000 mg | ORAL_TABLET | Freq: Two times a day (BID) | ORAL | 0 refills | Status: DC

## 2017-09-26 NOTE — Progress Notes (Signed)
   Subjective:    Patient ID: Paula Hughes, female    DOB: 1956-03-19, 61 y.o.   MRN: [id number]  HPI She complains of a several day history of dysuria, frequency, urgency.  In September she was treated for a UTI with Macrobid and to get had symptoms in October.  She was given Macrobid again.  In November she was seen in a [facility] and placed on Bactrim that she had an allergic reaction to.  She is here today with the above symptoms.  Review of Systems     Objective:   Physical Exam Alert and in no distress.  Urine dipstick was essentially negative.       Assessment & Plan:  Urine frequency - Plan: POCT Urinalysis DIP (Proadvantage Device)  Acute cystitis without hematuria - Plan: ciprofloxacin (CIPRO) 500 MG tablet, CULTURE, URINE COMPREHENSIVE  I will culture her urine and since she is having this many infections in a short period of time recommend she see her urologist again.

## 2017-09-29 LAB — CULTURE, URINE COMPREHENSIVE
MICRO NUMBER:: 81404685
SPECIMEN QUALITY:: ADEQUATE

## 2017-10-15 ENCOUNTER — Encounter: Payer: Self-pay | Admitting: Family Medicine

## 2017-10-15 DIAGNOSIS — Z5181 Encounter for therapeutic drug level monitoring: Secondary | ICD-10-CM

## 2017-10-15 DIAGNOSIS — Z0000 Encounter for general adult medical examination without abnormal findings: Secondary | ICD-10-CM

## 2017-10-15 NOTE — Progress Notes (Signed)
Chief Complaint  Patient presents with  . Advice Only    travel consult. Labs also.    Patient presents for travel consult. She is also fasting for her labs for her physical later this month. She will be traveling to [place] February 2nd. She will be going into rural areas, seeing monkeys, but not camping.  Discussed the recommendation for Hep A vaccination and typhoid vaccine. Malaria is recommended for high risk (pregnant, immunocompromised), and just general mosquito bite prevention/precautions for others.    PMH, PSH, SH, meds reviewed.  ROS:  No fever, chills, URI symptoms.  On her way to chiropractor today (routine).  Feels good.  PHYSICAL EXAM:  BP 128/76   Pulse 76   Ht 5\' 10"  (1.778 m)   Wt 191 lb 12.8 oz (87 kg)   BMI 27.52 kg/m   Well appearing, pleasant female in no distress. In good spirits. Alert, oriented.   ASSESSMENT/PLAN:  Travel advice encounter - counseled regarding Hep A, vivotif, mosquito prevention; advised to read CDC recommendations - Plan: typhoid (VIVOTIF) DR capsule  Medication monitoring encounter - Plan: TSH, Comprehensive metabolic panel, Lipid panel, CBC with Differential/Platelet  Annual physical exam - Plan: Comprehensive metabolic panel, CBC with Differential/Platelet  Need for hepatitis A immunization - Plan: Hepatitis A vaccine adult IM   Hep A #1 Vivotif rx sent.  F/u as scheduled for physical later this month.   Vivotif is the oral typhoid vaccine. Take 1 capsule every other day for 4 doses. This needs to be completed at least a week prior to travel. Take an hour before a meal (with cold or lukewarm drink, not with hot beverage). Do not crush or chew. This vaccination is good for 5 years. You need to return in 6 months for the second dose of Hepatitis A vaccine (to give lifelong immunity).  This first dose should be effective for your trip.

## 2017-10-17 ENCOUNTER — Encounter: Payer: Self-pay | Admitting: Family Medicine

## 2017-10-17 ENCOUNTER — Ambulatory Visit (INDEPENDENT_AMBULATORY_CARE_PROVIDER_SITE_OTHER): Payer: Self-pay | Admitting: Family Medicine

## 2017-10-17 VITALS — BP 128/76 | HR 76 | Ht 70.0 in | Wt 191.8 lb

## 2017-10-17 DIAGNOSIS — Z7189 Other specified counseling: Secondary | ICD-10-CM

## 2017-10-17 DIAGNOSIS — Z7184 Encounter for health counseling related to travel: Secondary | ICD-10-CM

## 2017-10-17 DIAGNOSIS — Z0000 Encounter for general adult medical examination without abnormal findings: Secondary | ICD-10-CM

## 2017-10-17 DIAGNOSIS — Z5181 Encounter for therapeutic drug level monitoring: Secondary | ICD-10-CM

## 2017-10-17 DIAGNOSIS — Z23 Encounter for immunization: Secondary | ICD-10-CM

## 2017-10-17 MED ORDER — TYPHOID VACCINE PO CPDR: 1.0000 | DELAYED_RELEASE_CAPSULE | ORAL | 0 refills | Status: DC

## 2017-10-17 NOTE — Patient Instructions (Signed)
  Vivotif is the oral typhoid vaccine. Take 1 capsule every other day for 4 doses. This needs to be completed at least a week prior to travel. Take an hour before a meal (with cold or lukewarm drink, not with hot beverage). Do not crush or chew. This vaccination is good for 5 years. You need to return in 6 months for the second dose of Hepatitis A vaccine (to give lifelong immunity).  This first dose should be effective for your trip.   Be sure to look at the [facility] travel recommendations for traveler's to [place].  See you later this month!

## 2017-10-18 LAB — LIPID PANEL
CHOL/HDL RATIO: 3 (calc) (ref <5.0)
Cholesterol: 235 mg/dL — ABNORMAL HIGH (ref <200)
HDL: 79 mg/dL (ref >50)
LDL CHOLESTEROL (CALC): 136 mg/dL — AB
NON-HDL CHOLESTEROL (CALC): 156 mg/dL — AB (ref <130)
TRIGLYCERIDES: 102 mg/dL (ref <150)

## 2017-10-18 LAB — CBC WITH DIFFERENTIAL/PLATELET
Basophils Absolute: 38 cells/uL (ref 0–200)
Basophils Relative: 1 %
EOS PCT: 2.6 %
Eosinophils Absolute: 99 cells/uL (ref 15–500)
HCT: 42.4 % (ref 35.0–45.0)
Hemoglobin: 14.3 g/dL (ref 11.7–15.5)
Lymphs Abs: 1577 cells/uL (ref 850–3900)
MCH: 30.2 pg (ref 27.0–33.0)
MCHC: 33.7 g/dL (ref 32.0–36.0)
MCV: 89.6 fL (ref 80.0–100.0)
MPV: 10.8 fL (ref 7.5–12.5)
Monocytes Relative: 9.9 %
NEUTROS PCT: 45 %
Neutro Abs: 1710 cells/uL (ref 1500–7800)
PLATELETS: 293 10*3/uL (ref 140–400)
RBC: 4.73 10*6/uL (ref 3.80–5.10)
RDW: 11.8 % (ref 11.0–15.0)
TOTAL LYMPHOCYTE: 41.5 %
WBC mixed population: 376 cells/uL (ref 200–950)
WBC: 3.8 10*3/uL (ref 3.8–10.8)

## 2017-10-18 LAB — COMPREHENSIVE METABOLIC PANEL
AG RATIO: 2.4 (calc) (ref 1.0–2.5)
ALT: 17 U/L (ref 6–29)
AST: 19 U/L (ref 10–35)
Albumin: 4.5 g/dL (ref 3.6–5.1)
Alkaline phosphatase (APISO): 82 U/L (ref 33–130)
BUN: 25 mg/dL (ref 7–25)
CO2: 30 mmol/L (ref 20–32)
Calcium: 10.6 mg/dL — ABNORMAL HIGH (ref 8.6–10.4)
Chloride: 104 mmol/L (ref 98–110)
Creat: 0.76 mg/dL (ref 0.50–0.99)
GLUCOSE: 93 mg/dL (ref 65–99)
Globulin: 1.9 g/dL (calc) (ref 1.9–3.7)
Potassium: 4.9 mmol/L (ref 3.5–5.3)
SODIUM: 139 mmol/L (ref 135–146)
Total Bilirubin: 0.6 mg/dL (ref 0.2–1.2)
Total Protein: 6.4 g/dL (ref 6.1–8.1)

## 2017-10-18 LAB — TSH: TSH: 0.65 m[IU]/L (ref 0.40–4.50)
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
## 2017-11-07 ENCOUNTER — Ambulatory Visit (INDEPENDENT_AMBULATORY_CARE_PROVIDER_SITE_OTHER): Payer: Self-pay | Admitting: Family Medicine

## 2017-11-07 ENCOUNTER — Encounter: Payer: Self-pay | Admitting: Family Medicine

## 2017-11-07 VITALS — BP 124/80 | HR 76 | Ht 69.0 in | Wt 189.0 lb

## 2017-11-07 DIAGNOSIS — M25562 Pain in left knee: Secondary | ICD-10-CM

## 2017-11-07 DIAGNOSIS — M545 Low back pain: Secondary | ICD-10-CM

## 2017-11-07 DIAGNOSIS — N390 Urinary tract infection, site not specified: Secondary | ICD-10-CM

## 2017-11-07 DIAGNOSIS — G8929 Other chronic pain: Secondary | ICD-10-CM

## 2017-11-07 DIAGNOSIS — E7800 Pure hypercholesterolemia, unspecified: Secondary | ICD-10-CM

## 2017-11-07 DIAGNOSIS — N3281 Overactive bladder: Secondary | ICD-10-CM

## 2017-11-07 DIAGNOSIS — E042 Nontoxic multinodular goiter: Secondary | ICD-10-CM

## 2017-11-07 DIAGNOSIS — Z0000 Encounter for general adult medical examination without abnormal findings: Secondary | ICD-10-CM

## 2017-11-07 LAB — POCT URINALYSIS DIP (PROADVANTAGE DEVICE)
BILIRUBIN UA: NEGATIVE mg/dL
Glucose, UA: NEGATIVE mg/dL
Leukocytes, UA: NEGATIVE
Nitrite, UA: NEGATIVE
Protein Ur, POC: NEGATIVE mg/dL
RBC UA: NEGATIVE
SPECIFIC GRAVITY, URINE: 1.02
Urobilinogen, Ur: NEGATIVE
pH, UA: 6 (ref 5.0–8.0)

## 2017-11-07 MED ORDER — ETODOLAC 400 MG PO TABS: 400.0000 mg | ORAL_TABLET | Freq: Two times a day (BID) | ORAL | 1 refills | Status: DC

## 2017-11-07 NOTE — Patient Instructions (Addendum)
  HEALTH MAINTENANCE RECOMMENDATIONS:  It is recommended that you get at least 30 minutes of aerobic exercise at least 5 days/week (for weight loss, you may need as much as 60-90 minutes). This can be any activity that gets your heart rate up. This can be divided in 10-15 minute intervals if needed, but try and build up your endurance at least once a week.  Weight bearing exercise is also recommended twice weekly.  Eat a healthy diet with lots of vegetables, fruits and fiber.  "Colorful" foods have a lot of vitamins (ie green vegetables, tomatoes, red peppers, etc).  Limit sweet tea, regular sodas and alcoholic beverages, all of which has a lot of calories and sugar.  Up to 1 alcoholic drink daily may be beneficial for women (unless trying to lose weight, watch sugars).  Drink a lot of water.  Calcium recommendations are 1200-1500 mg daily (1500 mg for postmenopausal women or women without ovaries), and vitamin D 1000 IU daily.  This should be obtained from diet and/or supplements (vitamins), and calcium should not be taken all at once, but in divided doses.  Monthly self breast exams and yearly mammograms for women over the age of 58 is recommended.  Sunscreen of at least SPF 30 should be used on all sun-exposed parts of the skin when outside between the hours of 10 am and 4 pm (not just when at beach or pool, but even with exercise, golf, tennis, and yard work!)  Use a sunscreen that says "broad spectrum" so it covers both UVA and UVB rays, and make sure to reapply every 1-2 hours.  Remember to change the batteries in your smoke detectors when changing your clock times in the spring and fall.  Use your seat belt every time you are in a car, and please drive safely and not be distracted with cell phones and texting while driving.  I recommend getting the new shingles vaccine (Shingrix). You will need to check with your insurance to see if it is covered, and if covered by Medicare Part D, you need to  get from the pharmacy rather than our office.  It is a series of 2 injections, spaced 2 months apart.  Return in 6 months for nurse/lab visit for chem panel and 2nd hepatitis A shot.  You don't need to fast.

## 2017-12-23 ENCOUNTER — Encounter: Payer: Self-pay | Admitting: Family Medicine

## 2017-12-25 ENCOUNTER — Ambulatory Visit: Payer: Self-pay | Admitting: Family Medicine

## 2017-12-25 ENCOUNTER — Encounter: Payer: Self-pay | Admitting: Family Medicine

## 2017-12-25 VITALS — BP 134/82 | HR 80 | Temp 98.3°F | Ht 70.5 in | Wt 189.0 lb

## 2017-12-25 DIAGNOSIS — Z780 Asymptomatic menopausal state: Secondary | ICD-10-CM

## 2017-12-25 DIAGNOSIS — N390 Urinary tract infection, site not specified: Secondary | ICD-10-CM

## 2017-12-25 DIAGNOSIS — R300 Dysuria: Secondary | ICD-10-CM

## 2017-12-25 LAB — POCT URINALYSIS DIP (PROADVANTAGE DEVICE)
GLUCOSE UA: NEGATIVE mg/dL
Ketones, POC UA: NEGATIVE mg/dL
Nitrite, UA: NEGATIVE
Protein Ur, POC: NEGATIVE mg/dL
RBC UA: NEGATIVE
SPECIFIC GRAVITY, URINE: 1.015
UUROB: NEGATIVE
pH, UA: 6 (ref 5.0–8.0)

## 2017-12-25 MED ORDER — ESTROGENS, CONJUGATED 0.625 MG/GM VA CREA: TOPICAL_CREAM | VAGINAL | 2 refills | Status: DC

## 2017-12-25 MED ORDER — NITROFURANTOIN MONOHYD MACRO 100 MG PO CAPS: 100.0000 mg | ORAL_CAPSULE | Freq: Two times a day (BID) | ORAL | 0 refills | Status: DC

## 2017-12-25 NOTE — Progress Notes (Signed)
Chief Complaint  Patient presents with  . Urinary Tract Infection    burning with urination, increased frequency x 4 days.    Patient presents with complaint of 4 days of urinary frequency and burning, with cramp/spasm at end of voiding (over bladder, spreading up from urethra). She denies fever, chills, nausea, vomiting, flank pain.  She has had frequent UTI's in the last six months. It had been suggested to see urologist, vs discussing with GYN.  She went off estradiol after retiring last year.  Infections started within 6-8 months of stopping the hormone replacement. Denies any significant vaginal dryness, pain/discomfort with intercourse, vaginal bleeding.  She has name of new GYN near her home (in [place]), doesn't have appointment yet.  PMH, PSH, SH reviewed  Current Outpatient Medications on File Prior to Visit  Medication Sig Dispense Refill  . Astaxanthin 4 MG CAPS Take 1 capsule by mouth daily.    . Calcium-Vitamin D-Vitamin K (CALCIUM SOFT CHEWS PO) Take 1 tablet by mouth daily. Reported on 01/05/2016    . Coenzyme Q10 (COQ10) 100 MG CAPS Take 100 mg by mouth daily.    Marland Kitchen etodolac (LODINE) 400 MG tablet Take 1 tablet (400 mg total) by mouth 2 (two) times daily. 180 tablet 1  . fexofenadine (ALLEGRA) 180 MG tablet Take 180 mg by mouth daily.    Marland Kitchen glucosamine-chondroitin 500-400 MG tablet Take 1 tablet by mouth 3 (three) times daily.    Marland Kitchen MAGNESIUM GLYCINATE PLUS PO Take 400 mg by mouth daily.    . Phenazopyridine HCl (AZO URINARY PAIN) 97.5 MG TABS Take 2 tablets by mouth 3 (three) times daily.    . Psyllium (METAMUCIL PO) Take 5 capsules by mouth daily.    . solifenacin (VESICARE) 10 MG tablet Take 1 tablet (10 mg total) by mouth daily. 90 tablet 3  . Multiple Vitamins-Minerals (IMMUNE SUPPORT VITAMIN C PO) Take by mouth.    . [DISCONTINUED] solifenacin (VESICARE) 10 MG tablet Take 10 mg by mouth daily.       No current facility-administered medications on file prior to visit.      Allergies  Allergen Reactions  . Adhesive [Tape]     Steri Strips cause rash, regular tape is okay  . Celecoxib Other (See Comments)    Body twitching  . Aleve [Naproxen Sodium] Other (See Comments)    shakes  . Doxycycline Calcium Nausea Only  . Sulfa Antibiotics Rash   ROS: no fever, chills, URI symptoms, headaches, dizziness, nausea, vomiting, diarrhea, bleeding, bruising, rash, flank pain or other concerns. Moods are good. She brings in photo book of her recent trip to [place]  PHYSICAL EXAM:  BP 134/82   Pulse 80   Temp 98.3 F (36.8 C) (Tympanic)   Ht 5' 10.5" (1.791 m)   Wt 189 lb (85.7 kg)   BMI 26.74 kg/m   Well appearing, pleasant female in good spirits, in no distress Back: no CVA tenderness Abdomen: soft, nontender, no organomegaly or mass, nontender in suprapubic area. Heart: regular rate and rhythm Lungs: clear Extremities: no edema  Urine dip:  Orange color, mod bili, 1+ leuks, rest is negative (taking Azo)   ASSESSMENT/PLAN:  Recurrent UTI - treat with macrobid; suspect related to atrophic/postmenopausal changes--trial of topical premarin cream - Plan: nitrofurantoin, macrocrystal-monohydrate, (MACROBID) 100 MG capsule, Urine Culture, conjugated estrogens (PREMARIN) vaginal cream  Burning with urination - Plan: POCT Urinalysis DIP (Proadvantage Device)  Postmenopausal - Plan: conjugated estrogens (PREMARIN) vaginal cream    Discussed atrophic  vaginitis as possible cause/contributing factor to recurrent bladder infections. These seemed to start after being off hormone replacement for 6-8 months. Let's try using topical premarin cream, applying to the urethral area (not using the full applicator intravaginally); use it once daily. Ultimately you may be able to cut back in frequency of use. If you continue to have frequent infections, we may need to start you on a preventative antibiotic vs have you see a urologist for further evaluation, and/or discuss  with your new gynecologist.  Take the nitrofurantoin twice daily for a week. We will contact you with your culture results when available.  Let us know sooner if your symptoms haven't resolved (should feel better within 48 hours).

## 2017-12-25 NOTE — Patient Instructions (Signed)
  Discussed atrophic vaginitis as possible cause/contributing factor to recurrent bladder infections. These seemed to start after being off hormone replacement for 6-8 months. Let's try using topical premarin cream, applying to the urethral area (not using the full applicator intravaginally); use it once daily. Ultimately you may be able to cut back in frequency of use. If you continue to have frequent infections, we may need to start you on a preventative antibiotic vs have you see a urologist for further evaluation, and/or discuss with your new gynecologist.  Take the nitrofurantoin twice daily for a week. We will contact you with your culture results when available.  Let us know sooner if your symptoms haven't resolved (should feel better within 48 hours).

## 2017-12-26 ENCOUNTER — Ambulatory Visit: Payer: Self-pay | Admitting: Family Medicine

## 2017-12-29 LAB — URINE CULTURE

## 2018-01-05 ENCOUNTER — Encounter: Payer: Self-pay | Admitting: Family Medicine

## 2018-01-05 MED ORDER — CEPHALEXIN 500 MG PO CAPS: 500.0000 mg | ORAL_CAPSULE | Freq: Two times a day (BID) | ORAL | 0 refills | Status: DC

## 2018-01-23 ENCOUNTER — Telehealth: Payer: Self-pay | Admitting: *Deleted

## 2018-01-23 ENCOUNTER — Ambulatory Visit (INDEPENDENT_AMBULATORY_CARE_PROVIDER_SITE_OTHER): Payer: Self-pay | Admitting: Family Medicine

## 2018-01-23 ENCOUNTER — Encounter: Payer: Self-pay | Admitting: Family Medicine

## 2018-01-23 VITALS — BP 118/76 | HR 80 | Temp 98.5°F | Ht 69.75 in | Wt 191.0 lb

## 2018-01-23 DIAGNOSIS — N3090 Cystitis, unspecified without hematuria: Secondary | ICD-10-CM

## 2018-01-23 DIAGNOSIS — R300 Dysuria: Secondary | ICD-10-CM

## 2018-01-23 LAB — POCT URINALYSIS DIP (PROADVANTAGE DEVICE)
Bilirubin, UA: NEGATIVE
Glucose, UA: NEGATIVE mg/dL
Ketones, POC UA: NEGATIVE mg/dL
NITRITE UA: NEGATIVE
PH UA: 6 (ref 5.0–8.0)
PROTEIN UA: NEGATIVE mg/dL
RBC UA: NEGATIVE
Specific Gravity, Urine: 1.015
UUROB: NEGATIVE

## 2018-01-23 MED ORDER — AMOXICILLIN 875 MG PO TABS: 875.0000 mg | ORAL_TABLET | Freq: Two times a day (BID) | ORAL | 0 refills | Status: DC

## 2018-01-23 NOTE — Progress Notes (Signed)
Chief Complaint  Patient presents with  . Urinary Frequency    and burning. Started yesterday and worse this am.    Patient presents with recurrent UTI symptoms.  Symptoms started yesterday--strange "tickle" in lower abdomen, urinary frequency.  She drank a lot of fluids, but today has increased urinary frequency and burning.  No vaginal discharge, no hematuria or vaginal bleeding.  +urgency/frequency. No odor/cloudiness.  She used premarin cream externally nightly for 2 weeks, then using 2x/week since then.  She has not had intercourse since the last UTI started in March. She has been sensitive to chemicals in the past, wonders if the premarin could be causing this.  She was last treated with macrobid--culture was sensitive to this, felt better when taking it, but then symptoms recurred.  She was then changed to Keflex 01/05/18 for 7 days.  She was fine only for about 10 days.  Urine culture 12/25/17: E.coli, pan-sensitive  She has seen Dr. McDiarmid in the past. She had made an appointment when she started having frequent UTI's, but canceled appointment when her symptoms had gotten better (it was a 2 month wait to see him).  PMH, PSH, SH reviewed  Outpatient Encounter Medications as of 01/23/2018  Medication Sig Note  . Astaxanthin 4 MG CAPS Take 1 capsule by mouth daily. 09/14/2015: (for small capillaries of extremities, per pt)  . Calcium-Vitamin D-Vitamin K (CALCIUM SOFT CHEWS PO) Take 1 tablet by mouth daily. Reported on 01/05/2016 06/13/2016: Takes sporadically  . Coenzyme Q10 (COQ10) 100 MG CAPS Take 100 mg by mouth daily.   Marland Kitchen conjugated estrogens (PREMARIN) vaginal cream Apply to urethral area once daily (vs intravaginally for 2 weeks then 2x/week long-term)   . etodolac (LODINE) 400 MG tablet Take 1 tablet (400 mg total) by mouth 2 (two) times daily.   . fexofenadine (ALLEGRA) 180 MG tablet Take 180 mg by mouth daily. 11/07/2016: Unsure of dose; gets OTC  . glucosamine-chondroitin  500-400 MG tablet Take 1 tablet by mouth 3 (three) times daily.   Marland Kitchen MAGNESIUM GLYCINATE PLUS PO Take 400 mg by mouth daily.   . Multiple Vitamins-Minerals (IMMUNE SUPPORT VITAMIN C PO) Take by mouth.   . Phenazopyridine HCl (AZO URINARY PAIN) 97.5 MG TABS Take 2 tablets by mouth 3 (three) times daily.   . Psyllium (METAMUCIL PO) Take 5 capsules by mouth daily.   . solifenacin (VESICARE) 10 MG tablet Take 1 tablet (10 mg total) by mouth daily. 11/07/2017: 1/2 tab  . amoxicillin (AMOXIL) 875 MG tablet Take 1 tablet (875 mg total) by mouth 2 (two) times daily.   . cephALEXin (KEFLEX) 500 MG capsule Take 1 capsule (500 mg total) by mouth 2 (two) times daily. (Patient not taking: Reported on 01/23/2018)   . nitrofurantoin, macrocrystal-monohydrate, (MACROBID) 100 MG capsule Take 1 capsule (100 mg total) by mouth 2 (two) times daily. (Patient not taking: Reported on 01/23/2018)   . [DISCONTINUED] solifenacin (VESICARE) 10 MG tablet Take 10 mg by mouth daily.      No facility-administered encounter medications on file as of 01/23/2018.    (not taking amoxil prior to visit, rx'd today.  Had completed the keflex prior to visit)  Allergies  Allergen Reactions  . Adhesive [Tape]     Steri Strips cause rash, regular tape is okay  . Celecoxib Other (See Comments)    Body twitching  . Aleve [Naproxen Sodium] Other (See Comments)    shakes  . Doxycycline Calcium Nausea Only  . Sulfa Antibiotics Rash  ROS: no fever, chills, nausea, vomiting, flank pain, hematuria, vaginal discharge, bleeding. +vaginal dryness.  No chest pain or other complaints.  No URI or allergy symptoms.   PHYSICAL EXAM:  BP 118/76   Pulse 80   Temp 98.5 F (36.9 C) (Tympanic)   Ht 5' 9.75" (1.772 m)   Wt 191 lb (86.6 kg)   BMI 27.60 kg/m   Well developed, pleasant female in no distress Heart: regular rate and rhythm, no murmur Lungs: clear bilaterally Back: no CVA tenderness Abdomen: mild suprapubic  tenderness Extremities: no edema Psych: normal mood, affect, hygiene and grooming Neuro: alert and oriented, cranial nerves grossly intact, normal gait  Urine dip: 1+ leuks, otherwise negative   ASSESSMENT/PLAN:  Recurrent cystitis - prior culture pan-sensitive. Treat with amoxil (change to Cipro if doesn't respond). f/u with urologist. Use premarin nightly - Plan: amoxicillin (AMOXIL) 875 MG tablet  Burning with urination - Plan: POCT Urinalysis DIP (Proadvantage Device)   Use premarin nightly until you see the urologist.  Try and get in with them as quickly as you can.  Treat with amoxil twice daily for 7 days. I'm not sending another culture (saving money, since we know there wasn't any resistance on the last culture).  If you don't respond or symptoms worsen, then we will change to cipro.

## 2018-01-23 NOTE — Telephone Encounter (Signed)
Need to see her.  If she can show up a little earlier, we can try and see her earlier (since it is going to take her time to give a urine sample before I get in the room with her).   She was last treated with macrobid--culture was sensitive to this, felt better when taking it, but then symptoms recurred.  She was then changed to Keflex 01/05/18 for 7 days.

## 2018-01-23 NOTE — Patient Instructions (Signed)
  Use premarin nightly until you see the urologist.  Try and get in with them as quickly as you can.  Treat with amoxil twice daily for 7 days. I'm not sending another culture (saving money, since we know there wasn't any resistance on the last culture).  If you don't respond or symptoms worsen, then we will change to cipro.

## 2018-01-23 NOTE — Telephone Encounter (Signed)
Patient left a message on my vm @ 10:35 stating that she has another UTI and she would like to stop by at 12:30 to leave a urine sample or if you could just call something in to [facility]. She has meeting at 2pm, I told her we had a 1:30-she said she would take it if necessary if she could be out in 15 minutes. Please advise.

## 2018-01-23 NOTE — Telephone Encounter (Signed)
Patient advised.

## 2018-01-30 ENCOUNTER — Encounter: Payer: Self-pay | Admitting: Family Medicine

## 2018-05-05 ENCOUNTER — Encounter: Payer: Self-pay | Admitting: Family Medicine

## 2018-05-05 DIAGNOSIS — N3281 Overactive bladder: Secondary | ICD-10-CM

## 2018-05-06 MED ORDER — SOLIFENACIN SUCCINATE 10 MG PO TABS: 10.0000 mg | ORAL_TABLET | Freq: Every day | ORAL | 3 refills | Status: DC

## 2018-05-08 ENCOUNTER — Ambulatory Visit: Payer: Self-pay | Admitting: Family Medicine

## 2018-05-08 ENCOUNTER — Other Ambulatory Visit (INDEPENDENT_AMBULATORY_CARE_PROVIDER_SITE_OTHER): Payer: Self-pay

## 2018-05-08 ENCOUNTER — Other Ambulatory Visit: Payer: Self-pay

## 2018-05-08 DIAGNOSIS — Z79899 Other long term (current) drug therapy: Secondary | ICD-10-CM

## 2018-05-08 DIAGNOSIS — Z23 Encounter for immunization: Secondary | ICD-10-CM

## 2018-05-09 LAB — COMPREHENSIVE METABOLIC PANEL
A/G RATIO: 2.6 — AB (ref 1.2–2.2)
ALT: 16 IU/L (ref 0–32)
AST: 20 IU/L (ref 0–40)
Albumin: 4.5 g/dL (ref 3.6–4.8)
Alkaline Phosphatase: 77 IU/L (ref 39–117)
BILIRUBIN TOTAL: 0.6 mg/dL (ref 0.0–1.2)
BUN/Creatinine Ratio: 23 (ref 12–28)
BUN: 23 mg/dL (ref 8–27)
CHLORIDE: 103 mmol/L (ref 96–106)
CO2: 25 mmol/L (ref 20–29)
Calcium: 10.8 mg/dL — ABNORMAL HIGH (ref 8.7–10.3)
Creatinine, Ser: 0.99 mg/dL (ref 0.57–1.00)
GFR calc non Af Amer: 61 mL/min/{1.73_m2} (ref >59)
GFR, EST AFRICAN AMERICAN: 71 mL/min/{1.73_m2} (ref >59)
Globulin, Total: 1.7 g/dL (ref 1.5–4.5)
Glucose: 85 mg/dL (ref 65–99)
POTASSIUM: 4.6 mmol/L (ref 3.5–5.2)
Sodium: 142 mmol/L (ref 134–144)
TOTAL PROTEIN: 6.2 g/dL (ref 6.0–8.5)

## 2018-06-11 ENCOUNTER — Encounter: Payer: Self-pay | Admitting: Family Medicine

## 2018-06-11 DIAGNOSIS — N3281 Overactive bladder: Secondary | ICD-10-CM

## 2018-06-11 MED ORDER — SOLIFENACIN SUCCINATE 10 MG PO TABS: 10.0000 mg | ORAL_TABLET | Freq: Every day | ORAL | 1 refills | Status: DC

## 2018-06-25 LAB — HM MAMMOGRAPHY

## 2018-07-17 ENCOUNTER — Encounter: Payer: Self-pay | Admitting: *Deleted

## 2018-07-21 ENCOUNTER — Encounter: Payer: Self-pay | Admitting: Family Medicine

## 2018-07-28 ENCOUNTER — Other Ambulatory Visit: Payer: Self-pay | Admitting: Family Medicine

## 2018-07-28 DIAGNOSIS — M545 Low back pain, unspecified: Secondary | ICD-10-CM

## 2018-07-28 DIAGNOSIS — M25562 Pain in left knee: Secondary | ICD-10-CM

## 2018-07-28 DIAGNOSIS — G8929 Other chronic pain: Secondary | ICD-10-CM

## 2018-08-20 ENCOUNTER — Encounter: Payer: Self-pay | Admitting: Family Medicine

## 2018-10-26 ENCOUNTER — Other Ambulatory Visit: Payer: Self-pay | Admitting: Family Medicine

## 2018-10-26 DIAGNOSIS — M25562 Pain in left knee: Secondary | ICD-10-CM

## 2018-10-26 DIAGNOSIS — G8929 Other chronic pain: Secondary | ICD-10-CM

## 2018-10-26 DIAGNOSIS — M545 Low back pain, unspecified: Secondary | ICD-10-CM

## 2018-10-27 NOTE — Telephone Encounter (Signed)
She is due for c-met. She has appt 1/29. We can do labs at her visit--verify she knows to come and we can trust her and do the 90d! (vs just filling 30d)

## 2018-10-27 NOTE — Telephone Encounter (Signed)
Is this okay to refill? 

## 2018-10-27 NOTE — Telephone Encounter (Signed)
Left message for patient to call back and confirm appt on 1/29 and I can refill for #90.

## 2018-10-29 ENCOUNTER — Telehealth: Payer: Self-pay | Admitting: *Deleted

## 2018-10-29 ENCOUNTER — Other Ambulatory Visit: Payer: Self-pay | Admitting: *Deleted

## 2018-10-29 DIAGNOSIS — Z79899 Other long term (current) drug therapy: Secondary | ICD-10-CM

## 2018-10-29 DIAGNOSIS — Z0000 Encounter for general adult medical examination without abnormal findings: Secondary | ICD-10-CM

## 2018-10-29 NOTE — Telephone Encounter (Signed)
Patient not having any thyroid symptoms. Ordered labs, released and mailed to patient. She will take slip to lab and have labs drawn.

## 2018-10-29 NOTE — Telephone Encounter (Signed)
Patient coming for appt 11/12/2018. She would like to have her labs done at [facility] on [address] in [place]. Is CMET all she needs?

## 2018-10-29 NOTE — Telephone Encounter (Signed)
No--it is her physical. She needs lipids and CBC also. We don't need to do TSH unless having symptoms (weight/energy/hair/skin/bowel changes)

## 2018-11-05 ENCOUNTER — Other Ambulatory Visit: Payer: Self-pay

## 2018-11-05 DIAGNOSIS — M545 Low back pain, unspecified: Secondary | ICD-10-CM

## 2018-11-05 DIAGNOSIS — M25562 Pain in left knee: Secondary | ICD-10-CM

## 2018-11-05 DIAGNOSIS — G8929 Other chronic pain: Secondary | ICD-10-CM

## 2018-11-05 LAB — LIPID PANEL
CHOLESTEROL TOTAL: 191 mg/dL (ref 100–199)
Chol/HDL Ratio: 2.7 ratio (ref 0.0–4.4)
HDL: 70 mg/dL (ref >39)
LDL Calculated: 108 mg/dL — ABNORMAL HIGH (ref 0–99)
TRIGLYCERIDES: 63 mg/dL (ref 0–149)
VLDL CHOLESTEROL CAL: 13 mg/dL (ref 5–40)

## 2018-11-05 LAB — CBC WITH DIFFERENTIAL/PLATELET
BASOS ABS: 0.1 10*3/uL (ref 0.0–0.2)
Basos: 1 %
EOS (ABSOLUTE): 0.2 10*3/uL (ref 0.0–0.4)
Eos: 5 %
Hematocrit: 43.1 % (ref 34.0–46.6)
Hemoglobin: 15 g/dL (ref 11.1–15.9)
IMMATURE GRANS (ABS): 0 10*3/uL (ref 0.0–0.1)
IMMATURE GRANULOCYTES: 0 %
Lymphocytes Absolute: 2.2 10*3/uL (ref 0.7–3.1)
Lymphs: 52 %
MCH: 31.3 pg (ref 26.6–33.0)
MCHC: 34.8 g/dL (ref 31.5–35.7)
MCV: 90 fL (ref 79–97)
MONOS ABS: 0.4 10*3/uL (ref 0.1–0.9)
Monocytes: 9 %
NEUTROS PCT: 33 %
Neutrophils Absolute: 1.4 10*3/uL (ref 1.4–7.0)
PLATELETS: 285 10*3/uL (ref 150–450)
RBC: 4.79 x10E6/uL (ref 3.77–5.28)
RDW: 11.4 % — AB (ref 11.7–15.4)
WBC: 4.4 10*3/uL (ref 3.4–10.8)

## 2018-11-05 LAB — COMPREHENSIVE METABOLIC PANEL
A/G RATIO: 2 (ref 1.2–2.2)
ALK PHOS: 82 IU/L (ref 39–117)
ALT: 21 IU/L (ref 0–32)
AST: 24 IU/L (ref 0–40)
Albumin: 4.6 g/dL (ref 3.8–4.8)
BUN/Creatinine Ratio: 18 (ref 12–28)
BUN: 18 mg/dL (ref 8–27)
Bilirubin Total: 0.6 mg/dL (ref 0.0–1.2)
CALCIUM: 10.5 mg/dL — AB (ref 8.7–10.3)
CHLORIDE: 103 mmol/L (ref 96–106)
CO2: 21 mmol/L (ref 20–29)
Creatinine, Ser: 1.01 mg/dL — ABNORMAL HIGH (ref 0.57–1.00)
GFR calc Af Amer: 69 mL/min/{1.73_m2} (ref >59)
GFR, EST NON AFRICAN AMERICAN: 60 mL/min/{1.73_m2} (ref >59)
GLOBULIN, TOTAL: 2.3 g/dL (ref 1.5–4.5)
Glucose: 100 mg/dL — ABNORMAL HIGH (ref 65–99)
POTASSIUM: 4.5 mmol/L (ref 3.5–5.2)
Sodium: 140 mmol/L (ref 134–144)
Total Protein: 6.9 g/dL (ref 6.0–8.5)

## 2018-11-05 NOTE — Telephone Encounter (Signed)
Pharmacy has sent a request for the pending medication.

## 2018-11-11 NOTE — Patient Instructions (Addendum)
  HEALTH MAINTENANCE RECOMMENDATIONS:  It is recommended that you get at least 30 minutes of aerobic exercise at least 5 days/week (for weight loss, you may need as much as 60-90 minutes). This can be any activity that gets your heart rate up. This can be divided in 10-15 minute intervals if needed, but try and build up your endurance at least once a week.  Weight bearing exercise is also recommended twice weekly.  Eat a healthy diet with lots of vegetables, fruits and fiber.  "Colorful" foods have a lot of vitamins (ie green vegetables, tomatoes, red peppers, etc).  Limit sweet tea, regular sodas and alcoholic beverages, all of which has a lot of calories and sugar.  Up to 1 alcoholic drink daily may be beneficial for women (unless trying to lose weight, watch sugars).  Drink a lot of water.  Calcium recommendations are 1200-1500 mg daily (1500 mg for postmenopausal women or women without ovaries), and vitamin D 1000 IU daily.  This should be obtained from diet and/or supplements (vitamins), and calcium should not be taken all at once, but in divided doses.  Monthly self breast exams and yearly mammograms for women over the age of 46 is recommended.  Sunscreen of at least SPF 30 should be used on all sun-exposed parts of the skin when outside between the hours of 10 am and 4 pm (not just when at beach or pool, but even with exercise, golf, tennis, and yard work!)  Use a sunscreen that says "broad spectrum" so it covers both UVA and UVB rays, and make sure to reapply every 1-2 hours.  Remember to change the batteries in your smoke detectors when changing your clock times in the spring and fall.  Use your seat belt every time you are in a car, and please drive safely and not be distracted with cell phones and texting while driving.  I recommend getting the new shingles vaccine (Shingrix). It is okay to wait until you are covered by Medicare Part D to help pay for this vaccine--you will need to get it  from the pharmacy rather than our office.  It is a series of 2 injections, spaced 2 months apart. You have partial protection from your prior shingles vaccine.  Given the potential risk for GI side effects from the etodolac, and your "sour stomach" that is temporarily relieved by eating--go ahead and start taking Prilosec OTC once daily for 2 weeks.  When you stop, if your symptoms recur, please let us know. If they don't stop on the prilosec OTC, let us know that also.
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
## 2018-11-12 ENCOUNTER — Ambulatory Visit (INDEPENDENT_AMBULATORY_CARE_PROVIDER_SITE_OTHER): Payer: Self-pay | Admitting: Family Medicine

## 2018-11-12 ENCOUNTER — Encounter: Payer: Self-pay | Admitting: Family Medicine

## 2018-11-12 VITALS — BP 120/76 | HR 68 | Ht 70.0 in | Wt 190.0 lb

## 2018-11-12 DIAGNOSIS — Z79899 Other long term (current) drug therapy: Secondary | ICD-10-CM

## 2018-11-12 DIAGNOSIS — E042 Nontoxic multinodular goiter: Secondary | ICD-10-CM

## 2018-11-12 DIAGNOSIS — Z0000 Encounter for general adult medical examination without abnormal findings: Secondary | ICD-10-CM

## 2018-11-12 DIAGNOSIS — Z780 Asymptomatic menopausal state: Secondary | ICD-10-CM

## 2018-11-12 DIAGNOSIS — G8929 Other chronic pain: Secondary | ICD-10-CM

## 2018-11-12 DIAGNOSIS — M545 Low back pain, unspecified: Secondary | ICD-10-CM

## 2018-11-12 DIAGNOSIS — E7800 Pure hypercholesterolemia, unspecified: Secondary | ICD-10-CM

## 2018-11-12 DIAGNOSIS — N390 Urinary tract infection, site not specified: Secondary | ICD-10-CM | POA: Insufficient documentation

## 2018-11-12 DIAGNOSIS — N3281 Overactive bladder: Secondary | ICD-10-CM

## 2018-11-12 MED ORDER — ETODOLAC 400 MG PO TABS: 400.0000 mg | ORAL_TABLET | Freq: Two times a day (BID) | ORAL | 0 refills | Status: DC | PRN

## 2018-11-13 ENCOUNTER — Encounter: Payer: Self-pay | Admitting: Family Medicine

## 2019-02-16 ENCOUNTER — Other Ambulatory Visit: Payer: Self-pay | Admitting: Family Medicine

## 2019-02-16 DIAGNOSIS — N3281 Overactive bladder: Secondary | ICD-10-CM

## 2019-05-18 ENCOUNTER — Other Ambulatory Visit: Payer: Self-pay | Admitting: Family Medicine

## 2019-05-18 ENCOUNTER — Telehealth: Payer: Self-pay | Admitting: *Deleted

## 2019-05-18 ENCOUNTER — Other Ambulatory Visit: Payer: Self-pay | Admitting: *Deleted

## 2019-05-18 DIAGNOSIS — N3281 Overactive bladder: Secondary | ICD-10-CM

## 2019-05-18 DIAGNOSIS — Z79899 Other long term (current) drug therapy: Secondary | ICD-10-CM

## 2019-05-18 NOTE — Telephone Encounter (Signed)
Patient does use daily, one a day. Had extra from when she had surgery and couldn't use. Sent rx for lab.

## 2019-05-18 NOTE — Telephone Encounter (Signed)
Refill came in for her vesicare and I noticed that you wanted her to call in 6 months for [place] to mail CMET orders and she will take to [facility]. If it's okay to do now I can order and print and mail.

## 2019-05-18 NOTE — Telephone Encounter (Signed)
That is to monitor her kidney/liver for her etodolac use. She was using it daily--last filled for #180 in 10/2018--surprised she isn't requesting refill for THIS (was taking once daily, BID only with bad flares).  If she is still using it daily, needs the c-met.

## 2019-05-29 LAB — COMPREHENSIVE METABOLIC PANEL
ALT: 22 IU/L (ref 0–32)
AST: 20 IU/L (ref 0–40)
Albumin/Globulin Ratio: 2.3 — ABNORMAL HIGH (ref 1.2–2.2)
Albumin: 4.5 g/dL (ref 3.8–4.8)
Alkaline Phosphatase: 61 IU/L (ref 39–117)
BUN/Creatinine Ratio: 25 (ref 12–28)
BUN: 21 mg/dL (ref 8–27)
Bilirubin Total: 0.6 mg/dL (ref 0.0–1.2)
CO2: 25 mmol/L (ref 20–29)
Calcium: 10.5 mg/dL — ABNORMAL HIGH (ref 8.7–10.3)
Chloride: 101 mmol/L (ref 96–106)
Creatinine, Ser: 0.85 mg/dL (ref 0.57–1.00)
GFR calc Af Amer: 84 mL/min/{1.73_m2} (ref >59)
GFR calc non Af Amer: 73 mL/min/{1.73_m2} (ref >59)
Globulin, Total: 2 g/dL (ref 1.5–4.5)
Glucose: 101 mg/dL — ABNORMAL HIGH (ref 65–99)
Potassium: 5 mmol/L (ref 3.5–5.2)
Sodium: 139 mmol/L (ref 134–144)
Total Protein: 6.5 g/dL (ref 6.0–8.5)

## 2019-11-16 ENCOUNTER — Encounter: Payer: Self-pay | Admitting: Family Medicine

## 2019-11-16 ENCOUNTER — Other Ambulatory Visit: Payer: Self-pay | Admitting: Family Medicine

## 2019-11-16 DIAGNOSIS — Z5181 Encounter for therapeutic drug level monitoring: Secondary | ICD-10-CM

## 2019-11-16 DIAGNOSIS — Z0000 Encounter for general adult medical examination without abnormal findings: Secondary | ICD-10-CM

## 2019-11-16 DIAGNOSIS — E7800 Pure hypercholesterolemia, unspecified: Secondary | ICD-10-CM

## 2019-11-16 DIAGNOSIS — N3281 Overactive bladder: Secondary | ICD-10-CM

## 2019-11-16 DIAGNOSIS — E042 Nontoxic multinodular goiter: Secondary | ICD-10-CM

## 2019-11-18 ENCOUNTER — Other Ambulatory Visit: Payer: Self-pay | Admitting: *Deleted

## 2019-11-18 DIAGNOSIS — E042 Nontoxic multinodular goiter: Secondary | ICD-10-CM

## 2019-11-18 DIAGNOSIS — Z5181 Encounter for therapeutic drug level monitoring: Secondary | ICD-10-CM

## 2019-11-18 DIAGNOSIS — Z0000 Encounter for general adult medical examination without abnormal findings: Secondary | ICD-10-CM

## 2019-11-18 DIAGNOSIS — E7800 Pure hypercholesterolemia, unspecified: Secondary | ICD-10-CM

## 2019-11-28 LAB — LIPID PANEL
Chol/HDL Ratio: 2.8 ratio (ref 0.0–4.4)
Cholesterol, Total: 187 mg/dL (ref 100–199)
HDL: 66 mg/dL (ref >39)
LDL Chol Calc (NIH): 106 mg/dL — ABNORMAL HIGH (ref 0–99)
Triglycerides: 81 mg/dL (ref 0–149)
VLDL Cholesterol Cal: 15 mg/dL (ref 5–40)

## 2019-11-28 LAB — COMPREHENSIVE METABOLIC PANEL
ALT: 32 IU/L (ref 0–32)
AST: 25 IU/L (ref 0–40)
Albumin/Globulin Ratio: 2 (ref 1.2–2.2)
Albumin: 4.4 g/dL (ref 3.8–4.8)
Alkaline Phosphatase: 71 IU/L (ref 39–117)
BUN/Creatinine Ratio: 23 (ref 12–28)
BUN: 16 mg/dL (ref 8–27)
Bilirubin Total: 0.4 mg/dL (ref 0.0–1.2)
CO2: 26 mmol/L (ref 20–29)
Calcium: 10.2 mg/dL (ref 8.7–10.3)
Chloride: 105 mmol/L (ref 96–106)
Creatinine, Ser: 0.71 mg/dL (ref 0.57–1.00)
GFR calc Af Amer: 105 mL/min/{1.73_m2} (ref >59)
GFR calc non Af Amer: 91 mL/min/{1.73_m2} (ref >59)
Globulin, Total: 2.2 g/dL (ref 1.5–4.5)
Glucose: 104 mg/dL — ABNORMAL HIGH (ref 65–99)
Potassium: 4.8 mmol/L (ref 3.5–5.2)
Sodium: 140 mmol/L (ref 134–144)
Total Protein: 6.6 g/dL (ref 6.0–8.5)

## 2019-11-28 LAB — CBC WITH DIFFERENTIAL/PLATELET
Basophils Absolute: 0 10*3/uL (ref 0.0–0.2)
Basos: 1 %
EOS (ABSOLUTE): 0.3 10*3/uL (ref 0.0–0.4)
Eos: 6 %
Hematocrit: 43 % (ref 34.0–46.6)
Hemoglobin: 14.6 g/dL (ref 11.1–15.9)
Immature Grans (Abs): 0 10*3/uL (ref 0.0–0.1)
Immature Granulocytes: 0 %
Lymphocytes Absolute: 2.2 10*3/uL (ref 0.7–3.1)
Lymphs: 47 %
MCH: 31.2 pg (ref 26.6–33.0)
MCHC: 34 g/dL (ref 31.5–35.7)
MCV: 92 fL (ref 79–97)
Monocytes Absolute: 0.5 10*3/uL (ref 0.1–0.9)
Monocytes: 10 %
Neutrophils Absolute: 1.7 10*3/uL (ref 1.4–7.0)
Neutrophils: 36 %
Platelets: 266 10*3/uL (ref 150–450)
RBC: 4.68 x10E6/uL (ref 3.77–5.28)
RDW: 11.2 % — ABNORMAL LOW (ref 11.7–15.4)
WBC: 4.6 10*3/uL (ref 3.4–10.8)

## 2019-11-28 LAB — TSH: TSH: 0.936 u[IU]/mL (ref 0.450–4.500)

## 2019-12-01 ENCOUNTER — Encounter: Payer: Self-pay | Admitting: Family Medicine

## 2019-12-01 DIAGNOSIS — R7301 Impaired fasting glucose: Secondary | ICD-10-CM | POA: Insufficient documentation

## 2019-12-01 NOTE — Patient Instructions (Addendum)
HEALTH MAINTENANCE RECOMMENDATIONS:  It is recommended that you get at least 30 minutes of aerobic exercise at least 5 days/week (for weight loss, you may need as much as 60-90 minutes). This can be any activity that gets your heart rate up. This can be divided in 10-15 minute intervals if needed, but try and build up your endurance at least once a week.  Weight bearing exercise is also recommended twice weekly.  Eat a healthy diet with lots of vegetables, fruits and fiber.  "Colorful" foods have a lot of vitamins (ie green vegetables, tomatoes, red peppers, etc).  Limit sweet tea, regular sodas and alcoholic beverages, all of which has a lot of calories and sugar.  Up to 1 alcoholic drink daily may be beneficial for women (unless trying to lose weight, watch sugars).  Drink a lot of water.  Calcium recommendations are 1200-1500 mg daily (1500 mg for postmenopausal women or women without ovaries), and vitamin D 1000 IU daily.  This should be obtained from diet and/or supplements (vitamins), and calcium should not be taken all at once, but in divided doses.  Monthly self breast exams and yearly mammograms for women over the age of 49 is recommended.  Sunscreen of at least SPF 30 should be used on all sun-exposed parts of the skin when outside between the hours of 10 am and 4 pm (not just when at beach or pool, but even with exercise, golf, tennis, and yard work!)  Use a sunscreen that says "broad spectrum" so it covers both UVA and UVB rays, and make sure to reapply every 1-2 hours.  Remember to change the batteries in your smoke detectors when changing your clock times in the spring and fall. Carbon monoxide detectors are recommended for your home.  Use your seat belt every time you are in a car, and please drive safely and not be distracted with cell phones and texting while driving.  COVID vaccine is recommended when it is your turn to get. Shingrix vaccine is also recommended (when you have  coverage)--wait 4 weeks after getting COVID vaccine before getting Shingrix (and don't get any other vaccines within 2 weeks of starting the COVID vaccine).  Continue yearly mammograms (I understand the wait until insurance coverage--please be sure to get one if you notice any change in your breasts on your monthly self-exams).    Food Choices for Gastroesophageal Reflux Disease, Adult When you have gastroesophageal reflux disease (GERD), the foods you eat and your eating habits are very important. Choosing the right foods can help ease the discomfort of GERD. Consider working with a diet and nutrition specialist (dietitian) to help you make healthy food choices. What general guidelines should I follow?  Eating plan  Choose healthy foods low in fat, such as fruits, vegetables, whole grains, low-fat dairy products, and lean meat, fish, and poultry.  Eat frequent, small meals instead of three large meals each day. Eat your meals slowly, in a relaxed setting. Avoid bending over or lying down until 2-3 hours after eating.  Limit high-fat foods such as fatty meats or fried foods.  Limit your intake of oils, butter, and shortening to less than 8 teaspoons each day.  Avoid the following: ? Foods that cause symptoms. These may be different for different people. Keep a food diary to keep track of foods that cause symptoms. ? Alcohol. ? Drinking large amounts of liquid with meals. ? Eating meals during the 2-3 hours before bed.  Cook foods using methods other  than frying. This may include baking, grilling, or broiling. Lifestyle  Maintain a healthy weight. Ask your health care provider what weight is healthy for you. If you need to lose weight, work with your health care provider to do so safely.  Exercise for at least 30 minutes on 5 or more days each week, or as told by your health care provider.  Avoid wearing clothes that fit tightly around your waist and chest.  Do not use any products  that contain nicotine or tobacco, such as cigarettes and e-cigarettes. If you need help quitting, ask your health care provider.  Sleep with the head of your bed raised. Use a wedge under the mattress or blocks under the bed frame to raise the head of the bed. What foods are not recommended? The items listed may not be a complete list. Talk with your dietitian about what dietary choices are best for you. Grains Pastries or quick breads with added fat. [place] toast. Vegetables Deep fried vegetables. [place] fries. Any vegetables prepared with added fat. Any vegetables that cause symptoms. For some people this may include tomatoes and tomato products, chili peppers, onions and garlic, and horseradish. Fruits Any fruits prepared with added fat. Any fruits that cause symptoms. For some people this may include citrus fruits, such as oranges, grapefruit, pineapple, and lemons. Meats and other protein foods High-fat meats, such as fatty beef or pork, hot dogs, ribs, ham, sausage, salami and bacon. Fried meat or protein, including fried fish and fried chicken. Nuts and nut butters. Dairy Whole milk and chocolate milk. Sour cream. Cream. Ice cream. Cream cheese. Milk shakes. Beverages Coffee and tea, with or without caffeine. Carbonated beverages. Sodas. Energy drinks. Fruit juice made with acidic fruits (such as orange or grapefruit). Tomato juice. Alcoholic drinks. Fats and oils Butter. Margarine. Shortening. Ghee. Sweets and desserts Chocolate and cocoa. Donuts. Seasoning and other foods Pepper. Peppermint and spearmint. Any condiments, herbs, or seasonings that cause symptoms. For some people, this may include curry, hot sauce, or vinegar-based salad dressings. Summary  When you have gastroesophageal reflux disease (GERD), food and lifestyle choices are very important to help ease the discomfort of GERD.  Eat frequent, small meals instead of three large meals each day. Eat your meals slowly, in  a relaxed setting. Avoid bending over or lying down until 2-3 hours after eating.  Limit high-fat foods such as fatty meat or fried foods. This information is not intended to replace advice given to you by your health care provider. Make sure you discuss any questions you have with your health care provider. Document Revised: 01/22/2019 Document Reviewed: 10/02/2016 Elsevier Patient Education  [place].

## 2019-12-01 NOTE — Progress Notes (Signed)
Chief Complaint  Patient presents with  . Annual Exam    nonfasting annual exam, no pap. No concerns.     Paula Hughes is a 64 y.o. female who presents for a complete physical.   Overactive bladder: On Vesicare, doing well.  She had h/o frequent UTI's, saw urologist and was put on trimethoprim daily for prevention.  She was able to stop this after starting vaginal estrogen suppositories from her GYN.  Stopped trimethoprim in 12/2018 and hasn't had any further UTI's.  Doing well with 2x/week estrogen vaginal suppositories. No longer having to take estroven, denies hot flashes.  Weaned off etodolac about 6 months ago. Used to take daily for LBP. Last year she reported sour stomach, we discussed potential risk for ulcers with chronic NSAIDs, so she started prilosec. She followed the directions and only used it x 2 weeks every 4 months. LBP is better when she is more active.  She has been using tylenol twice daily, and is considering cutting it back (works well for her, wonders if she still needs it).  The prilosec helped her symptoms.  She would use for 2 weeks every 4 months (as indicated on package).  Symptoms would completely resolve, and start to recur again around the 3 month mark. Had recurrent symptoms in 10/2019 she took Prilosec x 2 weeks, but it didn't help (only for about a week). Discomfort is more when her stomach is empty. She denies "pain", just "annoying"  She is reading book about Blood type diet--she has read that type O (which she is), has more stomach acid. Recommended she follow gluten-free and dairy-free diet. She has done this for 10 days so far. She has gained weight since last year--reports she has not been eating well (snacking on sweets, ice cream with chocolate chips).  Lost weight again last year by using MyFitness Pal again. That was how she lost her weight originally. She decide to try using this book/diet due to stomach issues. (Eat Right 4 Your Type). So far she  has noted more gas since on the diet. Eating more dried fruit.  She started some additional supplements, as recommended by book, and to help with her GI concerns.  Started licorice root extract.  Multinodular goiter:Previously was under the care of Dr. Buddy Duty.She reports that her nodules hadremained stable in size x 5 years, didn't have symptoms, anddidn't tolerate trial of suppressive therapy (didn't make a difference, didn't like how she felt)--no longer sees endo.She denies any symptoms related to the goiter, has not increased in size. In fact she reports that it has decreased in size (?she thinks possibly related to stopping the oral estrogen?).  Two years ago her lipids were notably higher than on prior checks (LDL went up to 136). She reported lots of sugar and chocolate at Christmas that year. She continues to eat8 eggs/week, and red meat most nights--no change in diet, she reports same diet for years. Husband cooks the meat, sometimes has fish or chicken. Repeat lipids last year were better (LDL 108). She had labs prior to visit, see below.  On WF study, getting regular Ab tests for COVID.   Immunization History  Administered Date(s) Administered  . Hepatitis A, Adult 10/17/2017, 05/08/2018  . Influenza Split 07/26/2011, 07/04/2012  . Influenza-Unspecified 07/16/2015, 07/09/2017, 07/17/2018, 06/18/2019  . Td 08/15/2004  . Tdap 07/26/2011  . Typhoid Live 10/18/2017  . Zoster 06/13/2016   Last Pap smear: per GYN (prior to hysterectomy)  Last mammogram: 06/2018 (waiting until Medicare)  Last colonoscopy:[facility] in [place] 08/13/16--normal, recheck in 10 years Last DEXA: calcaneal screen years ago (normal per pt)  Dentist: twice yearly  Ophtho:yearly Exercise:7 days/week elliptical (45 mins on 6 days, at least 15 days on the 7th); Weights on MWF (using elastic bands and hand weights).  T/Th/Sat core exercises.  Vitamin D screen: 07/2011, normal at  15   PMH, PSH, SH and FH were reviewed and updated  Outpatient Encounter Medications as of 12/02/2019  Medication Sig Note  . Acetaminophen (TYLENOL ARTHRITIS PAIN PO) Take 2 tablets by mouth in the morning and at bedtime.   . Astaxanthin 4 MG CAPS Take 1 capsule by mouth daily. 09/14/2015: (for small capillaries of extremities, per pt)  . Coenzyme Q10 (COQ10) 100 MG CAPS Take 100 mg by mouth daily.   Marland Kitchen conjugated estrogens (PREMARIN) vaginal cream Apply to urethral area once daily (vs intravaginally for 2 weeks then 2x/week long-term) 11/12/2018: Using suppositories 2x/week per GYN  . fexofenadine (ALLEGRA) 180 MG tablet Take 180 mg by mouth daily. 11/07/2016: Unsure of dose; gets OTC  . GARCINIA CAMBOGIA-CHROMIUM PO Take 2 capsules by mouth daily.   Marland Kitchen glucosamine-chondroitin 500-400 MG tablet Take 1 tablet by mouth 3 (three) times daily.   Marland Kitchen MAGNESIUM GLYCINATE PLUS PO Take 400 mg by mouth daily.   . Multiple Vitamins-Minerals (IMMUNE SUPPORT VITAMIN C PO) Take by mouth. 12/02/2019: 5x/week  . NON FORMULARY Take 1 capsule by mouth daily. 12/02/2019: Bladderwrack  . NON FORMULARY Take 1 tablet by mouth daily. [id number]: Licorice Root Extract  . Omega-3 Fatty Acids (FISH OIL) 1000 MG CAPS Take 1 capsule by mouth daily.   . Psyllium (METAMUCIL PO) Take 5 capsules by mouth daily.   . solifenacin (VESICARE) 10 MG tablet TAKE ONE TABLET BY MOUTH ONE TIME DAILY    . [DISCONTINUED] etodolac (LODINE) 400 MG tablet Take 1 tablet (400 mg total) by mouth 2 (two) times daily as needed for moderate pain.   . [DISCONTINUED] trimethoprim (TRIMPEX) 100 MG tablet Take 100 mg by mouth daily.    No facility-administered encounter medications on file as of 12/02/2019.   Allergies  Allergen Reactions  . Adhesive [Tape]     Steri Strips cause rash, regular tape is okay  . Celecoxib Other (See Comments)    Body twitching  . Aleve [Naproxen Sodium] Other (See Comments)    shakes  . Doxycycline Calcium Nausea  Only  . Sulfa Antibiotics Rash    ROS: The patient denies anorexia, fever, headaches, vision changes, decreased hearing, ear pain, sore throat, dizziness, syncope, dyspnea on exertion, cough, swelling, nausea, vomiting, diarrhea, constipation, abdominal pain, melena, hematochezia, hematuria, incontinence (controlled with meds), dysuria, vaginal bleeding, discharge, odor or itch, genital lesions, numbness, tingling, weakness, tremor, suspicious skin lesions, depression, anxiety, abnormal bleeding/bruising, or enlarged lymph nodes. Mild/rare hot flashes, no longer needs any supplements. Chronic lowback pain, controlled with tylenol Allergies controlled with allegra. Some knee stiffness in the mornings, no significant pain. Up 2-3x/night to void, unchanged. Epigastric discomfort, "sour stomach" per HPI +weight gain   PHYSICAL EXAM:  BP 128/70   Pulse 72   Temp (!) 96 F (35.6 C) (Other (Comment))   Ht 5' 9.75" (1.772 m)   Wt 200 lb 6.4 oz (90.9 kg)   BMI 28.96 kg/m   Wt Readings from Last 3 Encounters:  12/02/19 200 lb 6.4 oz (90.9 kg)  11/12/18 190 lb (86.2 kg)  01/23/18 191 lb (86.6 kg)    General Appearance:  Alert,  cooperative, no distress, appears stated age   Head:  Normocephalic, without obvious abnormality, atraumatic   Eyes:  PERRL, conjunctiva/corneas clear, EOM's intact, fundi benign   Ears:  Normal TM's and external ear canals   Nose:  Not examined, wearing mask due to COVID-19 pandemic  Throat:  Not examined, wearing mask due to COVID-19 pandemic   Neck:  Supple, no lymphadenopathy; thyroid: multinodular goiter noted, more prominent on right, unchanged; no carotid bruit or JVD   Back:  Spine nontender, no curvature, ROM normal, no CVA tenderness   Lungs:  Clear to auscultation bilaterally without wheezes, rales or ronchi; respirations unlabored   Chest Wall:  No tenderness or deformity   Heart:  Regular rate and rhythm, S1 and S2 normal, no  murmur, rub or gallop   Breast Exam:  Deferred to GYN   Abdomen:  Soft, non-tender, nondistended, normoactive bowel sounds, no masses, no hepatosplenomegaly   Genitalia:  deferred to GYN   Rectal:  Deferred to GYN   Extremities:  No clubbing, cyanosis or edema.   Pulses:  2+ and symmetric all extremities   Skin:  Skin color, texture, turgor are normal. No suspicious lesions.  Lymph nodes:  Cervical, supraclavicular, and axillary nodes normal   Neurologic:  Normal strength, sensation and gait; reflexes 2+ and symmetric throughout    Psych: Normal mood, affect, hygiene and grooming     Chemistry      Component Value Date/Time   NA 140 11/27/2019 0919   K 4.8 11/27/2019 0919   CL 105 11/27/2019 0919   CO2 26 11/27/2019 0919   BUN 16 11/27/2019 0919   CREATININE 0.71 11/27/2019 0919   CREATININE 0.76 10/17/2017 0938      Component Value Date/Time   CALCIUM 10.2 11/27/2019 0919   ALKPHOS 71 11/27/2019 0919   AST 25 11/27/2019 0919   ALT 32 11/27/2019 0919   BILITOT 0.4 11/27/2019 0919     Fasting glucose 104  Lab Results  Component Value Date   TSH 0.936 11/27/2019   Lab Results  Component Value Date   CHOL 187 11/27/2019   HDL 66 11/27/2019   LDLCALC 106 (H) 11/27/2019   TRIG 81 11/27/2019   CHOLHDL 2.8 11/27/2019   Lab Results  Component Value Date   WBC 4.6 11/27/2019   HGB 14.6 11/27/2019   HCT 43.0 11/27/2019   MCV 92 11/27/2019   PLT 266 11/27/2019     ASSESSMENT/PLAN:  Annual physical exam  Multinodular goiter - stable, asymptomatic  Overactive bladder  Impaired fasting glucose - Counseled re: proper diet, exercise, weight loss.  Hypercholesterolemia - improved with dietary changes. Cont lowfat, low cholesterol diet  Chronic low back pain without sciatica, unspecified back pain laterality - doing well on Tylenol BID, no longer needing NSAID.  Postmenopausal - symptoms improved on vaginal estrogen suppositories from  GYN, no longer needed Estroven Gold    Discussed monthly self breast exams and yearly mammograms (she is past due, but wanting to wait until she has Medicare to avoid OOP expense; at least 30 minutes of aerobic activity at least 5 days/week, weight-bearing exercise 2x/week; proper sunscreen use reviewed; healthy diet, including goals of calcium and vitamin D intake and alcohol recommendations (less than or equal to 1 drink/day) reviewed; regular seatbelt use; changing batteries in smoke detectors. Immunization recommendations discussed--UTD, continue yearly flu shots. TdaP next yearShingrix recommended(prefers to wait until she has Medicare for insurance coverage).Colonoscopy recommendations reviewed--UTD. DEXA age 5.  F/u 1 year, sooner prn.

## 2019-12-02 ENCOUNTER — Other Ambulatory Visit: Payer: Self-pay

## 2019-12-02 ENCOUNTER — Ambulatory Visit (INDEPENDENT_AMBULATORY_CARE_PROVIDER_SITE_OTHER): Payer: Self-pay | Admitting: Family Medicine

## 2019-12-02 ENCOUNTER — Encounter: Payer: Self-pay | Admitting: Family Medicine

## 2019-12-02 VITALS — BP 128/70 | HR 72 | Temp 96.0°F | Ht 69.75 in | Wt 200.4 lb

## 2019-12-02 DIAGNOSIS — M545 Low back pain: Secondary | ICD-10-CM

## 2019-12-02 DIAGNOSIS — G8929 Other chronic pain: Secondary | ICD-10-CM

## 2019-12-02 DIAGNOSIS — N3281 Overactive bladder: Secondary | ICD-10-CM

## 2019-12-02 DIAGNOSIS — Z780 Asymptomatic menopausal state: Secondary | ICD-10-CM

## 2019-12-02 DIAGNOSIS — E7800 Pure hypercholesterolemia, unspecified: Secondary | ICD-10-CM

## 2019-12-02 DIAGNOSIS — R7301 Impaired fasting glucose: Secondary | ICD-10-CM

## 2019-12-02 DIAGNOSIS — E042 Nontoxic multinodular goiter: Secondary | ICD-10-CM

## 2019-12-02 DIAGNOSIS — Z0000 Encounter for general adult medical examination without abnormal findings: Secondary | ICD-10-CM

## 2019-12-03 ENCOUNTER — Encounter: Payer: Self-pay | Admitting: Family Medicine

## 2020-02-08 ENCOUNTER — Other Ambulatory Visit: Payer: Self-pay | Admitting: Family Medicine

## 2020-02-08 DIAGNOSIS — N3281 Overactive bladder: Secondary | ICD-10-CM

## 2020-07-24 ENCOUNTER — Encounter: Payer: Self-pay | Admitting: Family Medicine

## 2020-11-10 ENCOUNTER — Other Ambulatory Visit: Payer: Self-pay | Admitting: Family Medicine

## 2020-11-10 DIAGNOSIS — N3281 Overactive bladder: Secondary | ICD-10-CM

## 2020-11-28 ENCOUNTER — Encounter: Payer: Self-pay | Admitting: Family Medicine

## 2020-11-28 DIAGNOSIS — R7301 Impaired fasting glucose: Secondary | ICD-10-CM

## 2020-11-28 DIAGNOSIS — E7800 Pure hypercholesterolemia, unspecified: Secondary | ICD-10-CM

## 2020-11-28 DIAGNOSIS — Z1231 Encounter for screening mammogram for malignant neoplasm of breast: Secondary | ICD-10-CM | POA: Diagnosis not present

## 2020-11-28 DIAGNOSIS — Z0000 Encounter for general adult medical examination without abnormal findings: Secondary | ICD-10-CM

## 2020-11-30 ENCOUNTER — Other Ambulatory Visit: Payer: Self-pay | Admitting: *Deleted

## 2020-11-30 DIAGNOSIS — Z0000 Encounter for general adult medical examination without abnormal findings: Secondary | ICD-10-CM

## 2020-11-30 DIAGNOSIS — R7301 Impaired fasting glucose: Secondary | ICD-10-CM

## 2020-11-30 DIAGNOSIS — E7800 Pure hypercholesterolemia, unspecified: Secondary | ICD-10-CM

## 2020-12-05 DIAGNOSIS — R7301 Impaired fasting glucose: Secondary | ICD-10-CM | POA: Diagnosis not present

## 2020-12-05 DIAGNOSIS — E7800 Pure hypercholesterolemia, unspecified: Secondary | ICD-10-CM | POA: Diagnosis not present

## 2020-12-05 DIAGNOSIS — Z0000 Encounter for general adult medical examination without abnormal findings: Secondary | ICD-10-CM | POA: Diagnosis not present

## 2020-12-06 LAB — COMPREHENSIVE METABOLIC PANEL
ALT: 26 IU/L (ref 0–32)
AST: 20 IU/L (ref 0–40)
Albumin/Globulin Ratio: 2.2 (ref 1.2–2.2)
Albumin: 4.7 g/dL (ref 3.8–4.8)
Alkaline Phosphatase: 69 IU/L (ref 44–121)
BUN/Creatinine Ratio: 24 (ref 12–28)
BUN: 19 mg/dL (ref 8–27)
Bilirubin Total: 0.4 mg/dL (ref 0.0–1.2)
CO2: 20 mmol/L (ref 20–29)
Calcium: 10.3 mg/dL (ref 8.7–10.3)
Chloride: 104 mmol/L (ref 96–106)
Creatinine, Ser: 0.8 mg/dL (ref 0.57–1.00)
GFR calc Af Amer: 90 mL/min/{1.73_m2} (ref >59)
GFR calc non Af Amer: 78 mL/min/{1.73_m2} (ref >59)
Globulin, Total: 2.1 g/dL (ref 1.5–4.5)
Glucose: 96 mg/dL (ref 65–99)
Potassium: 5.1 mmol/L (ref 3.5–5.2)
Sodium: 140 mmol/L (ref 134–144)
Total Protein: 6.8 g/dL (ref 6.0–8.5)

## 2020-12-06 LAB — HEMOGLOBIN A1C
Est. average glucose Bld gHb Est-mCnc: 97 mg/dL
Hgb A1c MFr Bld: 5 % (ref 4.8–5.6)

## 2020-12-06 LAB — LIPID PANEL
Chol/HDL Ratio: 3.1 ratio (ref 0.0–4.4)
Cholesterol, Total: 215 mg/dL — ABNORMAL HIGH (ref 100–199)
HDL: 69 mg/dL (ref >39)
LDL Chol Calc (NIH): 130 mg/dL — ABNORMAL HIGH (ref 0–99)
Triglycerides: 91 mg/dL (ref 0–149)
VLDL Cholesterol Cal: 16 mg/dL (ref 5–40)

## 2020-12-13 ENCOUNTER — Encounter: Payer: Self-pay | Admitting: Family Medicine

## 2020-12-13 DIAGNOSIS — D229 Melanocytic nevi, unspecified: Secondary | ICD-10-CM | POA: Diagnosis not present

## 2020-12-13 DIAGNOSIS — L814 Other melanin hyperpigmentation: Secondary | ICD-10-CM | POA: Diagnosis not present

## 2020-12-13 DIAGNOSIS — D485 Neoplasm of uncertain behavior of skin: Secondary | ICD-10-CM | POA: Diagnosis not present

## 2020-12-13 DIAGNOSIS — L578 Other skin changes due to chronic exposure to nonionizing radiation: Secondary | ICD-10-CM | POA: Diagnosis not present

## 2020-12-13 NOTE — Progress Notes (Signed)
Chief Complaint  Patient presents with  . Medicare Wellness    Nonfasting Welcome to Medicare. Has had twice in the last year-heart racing in the middle of the night. Would prefer to do DEXA at same place as mammo, [facility]. Wants to know if we can call Medicare for a Tier reduction/exemption on generic Vesicare [phone].    Paula Hughes is a 65 y.o. female who presents for Welcome to Medicare visit, and follow-up on chronic medical conditions.  She had labs done prior to her visit, see below. She saw GYN in 11/2020. She has the following concerns:  Twice in the last year she experienced her heart racing in the middle of the night. It occurred once in the summer, and happened again in February.  Both times it woke her up from sleep. Pulse was over 100 (but likely around 110, not much higher).  Had a gradual decline, never occurred during the day or with exercise. She denies any known nightmare. No associated shortness of breath, felt just slightly tight in chest.  Overactive bladder: She continues to do well on Vesicare. She had h/o frequent UTI's, saw urologist and was put on trimethoprim daily for prevention.  She was able to stop this after starting vaginal estrogen suppositories from her GYN.  Stopped trimethoprim in 12/2018 and hasn't had any further UTI's.  Doing well with 2x/week estrogen vaginal suppositories. Denies hot flashes (rare/mild). She is asking about Tier reduction/exemption for generic Vesicare, but also reports that it is currently very affordable.  $30/month from Medicare, but she has been getting it from [employer] for $30/90d.  She asked if it was "easy" or likely to get an exemption, and ultimately advised it was okay for [place] not to call and request.  Low back pain--flares when she is more active.  She uses tylenol twice daily prn.  She previously used etodolac (long-term), stopped when she developed some GI symptoms, which resolved with use of prilosec and stopping  NSAID. She still has 3 pills left of etodolac from last prescription.  Asking for a refill, to use if needed. Maybe uses it once a month. She no longer has any GI issues (since using DGL), and since on gluten-free and dairy-free diet (which she read about, based on her blood type).  Multinodular goiter:Previously was under the care of Dr. Buddy Duty.She reports that her nodules hadremained stable in size x 5 years, didn't have symptoms, anddidn't tolerate trial of suppressive therapy (didn't make a difference, didn't like how she felt)--no longer sees endo.She denies any symptoms related to the goiter, has not increased in size (if anything, seems smaller).  In 2019 herlipids were notably higher than on prior checks (LDL went up to 136). She reportedlots of sugar and chocolate at Christmas that year. She continues to eat9 eggs/week, and red meat most nights--no change in diet, she reports same diet for years. Has salmon once a week. She has small amount of cheese with her eggs. +mayonnaise and butter in her diet. Repeat lipids were better (last LDL was 106 in 2021). She had labs prior to visit, see below.  Immunization History  Administered Date(s) Administered  . Hepatitis A, Adult 10/17/2017, 05/08/2018  . Influenza Split 07/26/2011, 07/04/2012  . Influenza-Unspecified 07/16/2015, 07/09/2017, 07/17/2018, 06/18/2019, 07/05/2020  . Moderna Sars-Covid-2 Vaccination 08/16/2020  . PFIZER(Purple Top)SARS-COV-2 Vaccination 01/07/2020, 01/27/2020  . Td 08/15/2004  . Tdap 07/26/2011  . Typhoid Live 10/18/2017  . Zoster 06/13/2016   Last Pap smear: per GYN (prior  to hysterectomy)  Last mammogram:11/28/20--additional imaging/views needed, scheduled for 3/9. Last colonoscopy:[facility] in [place] 08/13/16--normal, recheck in 10 years Last DEXA: calcaneal screen years ago (normal per pt)  Dentist: twice yearly  Ophtho:yearly Exercise:7 days/week elliptical (45 mins  on 6 days, at least 15 days on the 7th); Weights on MWF (using elastic bands and hand weights).  T/Th/Sat core exercises.  Vitamin D screen: 07/2011, normal at 99  Other doctors caring for patient include: GYN: Dr. Minus Liberty [place] ([facility] in [place]) Ortho: Dr. Jefferson Fuel Urology: Dr. Matilde Sprang GI: Dr.Nicholas Mary Sella ([facility] in [place]) Dentist: Dr. Jerel Shepherd Ophtho: Dr. Lynnae Prude Derm: Dr. Nancy Fetter   Depression screen:  negative Fall Screen: negative Functional Status Survey: notable for some short-term memory, rare loss of bladder/bowel control (had some accidents when she waited too long) Mini-Cog: normal See Epic for full questionnaires/screens  End of Life Discussion:  Patient has a living will and medical power of attorney   PMH, PSH, [place] and FH were reviewed and updated  Outpatient Encounter Medications as of 12/14/2020  Medication Sig Note  . Acetaminophen (TYLENOL ARTHRITIS PAIN PO) Take 2 tablets by mouth in the morning and at bedtime.   . Astaxanthin 4 MG CAPS Take 1 capsule by mouth daily. 09/14/2015: (for small capillaries of extremities, per pt)  . Coenzyme Q10 (COQ10) 100 MG CAPS Take 100 mg by mouth daily.   Marland Kitchen conjugated estrogens (PREMARIN) vaginal cream Apply to urethral area once daily (vs intravaginally for 2 weeks then 2x/week long-term) 11/12/2018: Using suppositories 2x/week per GYN  . CRANBERRY PO Take 2 tablets by mouth daily. 12/14/2020: W/D-Mannose 400mg  cranberry/1000mg  D-Mannose  . etodolac (LODINE) 400 MG tablet Take 1 tablet (400 mg total) by mouth 2 (two) times daily as needed for moderate pain.   . fexofenadine (ALLEGRA) 180 MG tablet Take 180 mg by mouth daily. 11/07/2016: Unsure of dose; gets OTC  . GARCINIA CAMBOGIA-CHROMIUM PO Take 2 capsules by mouth daily.   Marland Kitchen glucosamine-chondroitin 500-400 MG tablet Take 1 tablet by mouth 3 (three) times daily.   Marland Kitchen MAGNESIUM GLYCINATE PLUS PO Take 400 mg by mouth daily.   . Melatonin 10  MG SUBL Place under the tongue. 12/14/2020: Has been taking 15mg  just recently (more trouble sleeping since starting neuriva supplement)  . Multiple Vitamins-Minerals (IMMUNE SUPPORT VITAMIN C PO) Take by mouth. 12/02/2019: 5x/week  . NON FORMULARY Take 1 tablet by mouth daily. 9/74/1638: Licorice Root Extract  . Omega-3 Fatty Acids (FISH OIL) 1000 MG CAPS Take 1 capsule by mouth daily.   . Psyllium (METAMUCIL PO) Take 5 capsules by mouth daily.   . solifenacin (VESICARE) 10 MG tablet TAKE ONE TABLET BY MOUTH ONE TIME DAILY   . [DISCONTINUED] NON FORMULARY Take 1 capsule by mouth daily. 12/02/2019: Bladderwrack   No facility-administered encounter medications on file as of 12/14/2020.   Allergies  Allergen Reactions  . Adhesive [Tape]     Steri Strips cause rash, regular tape is okay  . Celecoxib Other (See Comments)    Body twitching  . Aleve [Naproxen Sodium] Other (See Comments)    shakes  . Doxycycline Calcium Nausea Only  . Sulfa Antibiotics Rash    ROS: The patient denies anorexia, fever, headaches, vision changes, decreased hearing, ear pain, sore throat, dizziness, syncope, dyspnea on exertion, cough, swelling, nausea, vomiting, diarrhea, constipation, abdominal pain, melena, hematochezia, hematuria, incontinence (controlled with meds), dysuria, vaginal bleeding, discharge, odor or itch, genital lesions, numbness, tingling, weakness, tremor, suspicious skin lesions, depression, anxiety,  abnormal bleeding/bruising, or enlarged lymph nodes. Mild/rare hot flashes, no longer needs any supplements. Chronic lowback pain, controlled with tylenol (only rarely need lodine) Allergies controlled with allegra. Some knee stiffness in the mornings, mild pain in L knee. Up 2-3x/night to void, unchanged. Denies dysuria, incontinence Saw dermatologist yesterday (benign bump on nose)   PHYSICAL EXAM:  .BP 122/82   Pulse 72   Ht 5\' 9"  (1.753 m)   Wt 197 lb (89.4 kg)   BMI 29.09 kg/m   Wt  Readings from Last 3 Encounters:  12/14/20 197 lb (89.4 kg)  12/02/19 200 lb 6.4 oz (90.9 kg)  11/12/18 190 lb (86.2 kg)    General Appearance:  Alert, cooperative, no distress, appears stated age   Head:  Normocephalic, without obvious abnormality, atraumatic   Eyes:  PERRL, conjunctiva/corneas clear, EOM's intact, fundi benign   Ears:  Normal TM's and external ear canals   Nose:  Not examined, wearing mask due to COVID-19 pandemic  Throat:  Not examined, wearing mask due to COVID-19 pandemic   Neck:  Supple, no lymphadenopathy; thyroid: multinodular goiter noted, more prominent on right, unchanged; no carotid bruit or JVD   Back:  Spine nontender, no curvature, ROM normal, no CVA tenderness   Lungs:  Clear to auscultation bilaterally without wheezes, rales or ronchi; respirations unlabored   Chest Wall:  No tenderness or deformity   Heart:  Regular rate and rhythm, S1 and S2 normal, no murmur, rub or gallop   Breast Exam:  Deferred to GYN   Abdomen:  Soft, non-tender, nondistended, normoactive bowel sounds, no masses, no hepatosplenomegaly   Genitalia:  deferred to GYN   Rectal:  Deferred to GYN   Extremities:  No clubbing, cyanosis or edema.   Pulses:  2+ and symmetric all extremities   Skin:  Skin color, texture, turgor are normal. No suspicious lesions.  Lymph nodes:  Cervical, supraclavicular, and axillary nodes normal   Neurologic:  Normal strength, sensation and gait; reflexes 2+ and symmetric throughout    Psych: Normal mood, affect, hygiene and grooming    Chemistry      Component Value Date/Time   NA 140 12/05/2020 0927   K 5.1 12/05/2020 0927   CL 104 12/05/2020 0927   CO2 20 12/05/2020 0927   BUN 19 12/05/2020 0927   CREATININE 0.80 12/05/2020 0927   CREATININE 0.76 10/17/2017 0938      Component Value Date/Time   CALCIUM 10.3 12/05/2020 0927   ALKPHOS 69 12/05/2020 0927   AST 20 12/05/2020 0927   ALT 26 12/05/2020  0927   BILITOT 0.4 12/05/2020 0927     Fasting glucose 96  Lab Results  Component Value Date   HGBA1C 5.0 12/05/2020   Lab Results  Component Value Date   CHOL 215 (H) 12/05/2020   HDL 69 12/05/2020   LDLCALC 130 (H) 12/05/2020   TRIG 91 12/05/2020   CHOLHDL 3.1 12/05/2020    ASSESSMENT/PLAN:  Welcome to Medicare preventive visit  Hypercholesterolemia - reviewed lowfat, low cholesterol diet.  Encouraged her to cut back on mayonnaise, butter, red meat, egg yolks  Overactive bladder - controlled with Vesicare  Multinodular goiter - stable, asymptomatic  Chronic low back pain without sciatica, unspecified back pain laterality - doing well overall.  Refilled etodolac to have on hand prn - Plan: etodolac (LODINE) 400 MG tablet  Vaccine counseling - TdaP and Shingrix recommended, to get from pharmacy.  Risks/SE reviewed  Need for pneumococcal vaccination - [id number] given today  Palpitations - infrequent, without red flags. To contact us if sx increase in frequency, duration, associated CP, SOB   Discussed monthly self breast exams and yearly mammograms (scheduled for diagnostic/f/u); at least 30 minutes of aerobic activity at least 5 days/week, weight-bearing exercise 2x/week; proper sunscreen use reviewed;healthy diet, including goals of calcium and vitamin D intake and alcohol recommendations (less than or equal to 1 drink/day) reviewed; regular seatbelt use; changing batteries in smoke detectors. Immunization recommendations discussed--continue yearly flu shots (now high dose). TdaP is due, to get from pharmacy. Shingrix recommended, to get from pharmacy. [id number] given today. Colonoscopy recommendations reviewed--UTD. DEXA recommended now. Written rx given, she will get at [facility] where she gets mammo.   Requested copies of living will and healthcare power of attorney.  MOST form completed.  Full Code, Full Care.  F/u 1 year, sooner prn.   Medicare Attestation I  have personally reviewed: The patient's medical and social history Their use of alcohol, tobacco or illicit drugs Their current medications and supplements The patient's functional ability including ADLs,fall risks, home safety risks, cognitive, and hearing and visual impairment Diet and physical activities Evidence for depression or mood disorders  The patient's weight, height, BMI have been recorded in the chart.  I have made referrals, counseling, and provided education to the patient based on review of the above and I have provided the patient with a written personalized care plan for preventive services.     Vikki Ports, MD

## 2020-12-13 NOTE — Patient Instructions (Addendum)
HEALTH MAINTENANCE RECOMMENDATIONS:  It is recommended that you get at least 30 minutes of aerobic exercise at least 5 days/week (for weight loss, you may need as much as 60-90 minutes). This can be any activity that gets your heart rate up. This can be divided in 10-15 minute intervals if needed, but try and build up your endurance at least once a week.  Weight bearing exercise is also recommended twice weekly.  Eat a healthy diet with lots of vegetables, fruits and fiber.  "Colorful" foods have a lot of vitamins (ie green vegetables, tomatoes, red peppers, etc).  Limit sweet tea, regular sodas and alcoholic beverages, all of which has a lot of calories and sugar.  Up to 1 alcoholic drink daily may be beneficial for women (unless trying to lose weight, watch sugars).  Drink a lot of water.  Calcium recommendations are 1200-1500 mg daily (1500 mg for postmenopausal women or women without ovaries), and vitamin D 1000 IU daily.  This should be obtained from diet and/or supplements (vitamins), and calcium should not be taken all at once, but in divided doses.  Monthly self breast exams and yearly mammograms for women over the age of 55 is recommended.  Sunscreen of at least SPF 30 should be used on all sun-exposed parts of the skin when outside between the hours of 10 am and 4 pm (not just when at beach or pool, but even with exercise, golf, tennis, and yard work!)  Use a sunscreen that says "broad spectrum" so it covers both UVA and UVB rays, and make sure to reapply every 1-2 hours.  Remember to change the batteries in your smoke detectors when changing your clock times in the spring and fall. Carbon monoxide detectors are recommended for your home.  Use your seat belt every time you are in a car, and please drive safely and not be distracted with cell phones and texting while driving.   Paula Hughes , Thank you for taking time to come for your Welcome to Medicare Physical. I appreciate your ongoing  commitment to your health goals. Please review the following plan we discussed and let me know if I can assist you in the future.    This is a list of the screening recommended for you and due dates:  Health Maintenance  Topic Date Due   Mammogram  06/25/2020   COVID-19 Vaccine (3 - Booster for Pfizer series) 07/28/2020   DEXA scan (bone density measurement)  Never done   Pneumonia vaccines (1 of 2 - PCV13) 12/09/2020   Tetanus Vaccine  07/25/2021   Colon Cancer Screening  08/13/2026   Flu Shot  Completed    Hepatitis C: One time screening is recommended by Center for Disease Control  (CDC) for  adults born from [birth year] through 1965.   Completed   HIV Screening  Completed   HPV Vaccine  Aged Out   I saw that you had mammogram last month; please be sure to get the additional views, and let us know when you do that.   (we can see the results if we look in Care Everywhere, but it doesn't come to my in-basket; I need to put the information into your [facility] chart).  Bone density test is recommended (DEXA). We gave you written prescription (they likely will need to fax [place] an order to be signed).   TdaP (tetanus booster) is due this year (07/2021).You will need to get this from the pharmacy (covered by Part D).  I  recommend getting the new shingles vaccine (Shingrix). Since you have Medicare, you will need to get this from the pharmacy, as it is covered by Part D. This is a series of 2 injections, spaced 2 months apart.  Pneumonia vaccine was given today.  COVID booster--you have had this and date was entered today (not reflected in the chart above)  Please cut back on mayonnaise and butter in your diet. If you truly wanted to get your cholesterol down further (which you don't need to), it would be recommended to cut back on egg yolks and cheese, and cut back on the red meat in our diet.  Please get [place] copies of your living will and healthcare power of attorney so it can be scanned  into your chart.  If you develop the rapid heart rate again--note the pulse, if regular or irregular, and how fast. Note if you had any associated symptoms, how long it lasted, and how it stopped (gradual, sudden).

## 2020-12-14 ENCOUNTER — Ambulatory Visit (INDEPENDENT_AMBULATORY_CARE_PROVIDER_SITE_OTHER): Payer: Medicare Other | Admitting: Family Medicine

## 2020-12-14 ENCOUNTER — Other Ambulatory Visit: Payer: Self-pay

## 2020-12-14 ENCOUNTER — Encounter: Payer: Self-pay | Admitting: Family Medicine

## 2020-12-14 VITALS — BP 122/82 | HR 72 | Ht 69.0 in | Wt 197.0 lb

## 2020-12-14 DIAGNOSIS — Z7185 Encounter for immunization safety counseling: Secondary | ICD-10-CM

## 2020-12-14 DIAGNOSIS — M545 Low back pain, unspecified: Secondary | ICD-10-CM | POA: Diagnosis not present

## 2020-12-14 DIAGNOSIS — Z23 Encounter for immunization: Secondary | ICD-10-CM

## 2020-12-14 DIAGNOSIS — N3281 Overactive bladder: Secondary | ICD-10-CM

## 2020-12-14 DIAGNOSIS — E042 Nontoxic multinodular goiter: Secondary | ICD-10-CM | POA: Diagnosis not present

## 2020-12-14 DIAGNOSIS — Z0000 Encounter for general adult medical examination without abnormal findings: Secondary | ICD-10-CM | POA: Diagnosis not present

## 2020-12-14 DIAGNOSIS — G8929 Other chronic pain: Secondary | ICD-10-CM

## 2020-12-14 DIAGNOSIS — R002 Palpitations: Secondary | ICD-10-CM

## 2020-12-14 DIAGNOSIS — E7800 Pure hypercholesterolemia, unspecified: Secondary | ICD-10-CM

## 2020-12-14 MED ORDER — ETODOLAC 400 MG PO TABS: 400.0000 mg | ORAL_TABLET | Freq: Two times a day (BID) | ORAL | 0 refills | Status: DC | PRN

## 2020-12-15 ENCOUNTER — Encounter: Payer: Self-pay | Admitting: Family Medicine

## 2020-12-19 NOTE — Addendum Note (Signed)
Addended by: Carolee Rota F on: 12/19/2020 04:47 PM   Modules accepted: Orders

## 2020-12-20 ENCOUNTER — Encounter: Payer: Self-pay | Admitting: Family Medicine

## 2020-12-21 DIAGNOSIS — R928 Other abnormal and inconclusive findings on diagnostic imaging of breast: Secondary | ICD-10-CM | POA: Diagnosis not present

## 2020-12-21 LAB — HM MAMMOGRAPHY

## 2020-12-28 ENCOUNTER — Encounter: Payer: Self-pay | Admitting: Family Medicine

## 2020-12-28 ENCOUNTER — Encounter: Payer: Self-pay | Admitting: *Deleted

## 2021-02-13 ENCOUNTER — Other Ambulatory Visit: Payer: Self-pay | Admitting: Family Medicine

## 2021-02-13 DIAGNOSIS — N3281 Overactive bladder: Secondary | ICD-10-CM

## 2021-02-14 DIAGNOSIS — Z13820 Encounter for screening for osteoporosis: Secondary | ICD-10-CM | POA: Diagnosis not present

## 2021-02-14 DIAGNOSIS — Z780 Asymptomatic menopausal state: Secondary | ICD-10-CM | POA: Diagnosis not present

## 2021-02-14 LAB — HM DEXA SCAN: HM Dexa Scan: NORMAL

## 2021-02-23 ENCOUNTER — Encounter: Payer: Self-pay | Admitting: *Deleted

## 2021-04-13 ENCOUNTER — Encounter: Payer: Self-pay | Admitting: Family Medicine

## 2021-06-30 ENCOUNTER — Encounter: Payer: Self-pay | Admitting: Family Medicine

## 2021-07-03 ENCOUNTER — Encounter: Payer: Self-pay | Admitting: *Deleted

## 2021-07-12 DIAGNOSIS — Z471 Aftercare following joint replacement surgery: Secondary | ICD-10-CM | POA: Diagnosis not present

## 2021-07-12 DIAGNOSIS — M25561 Pain in right knee: Secondary | ICD-10-CM | POA: Diagnosis not present

## 2021-07-12 DIAGNOSIS — M25462 Effusion, left knee: Secondary | ICD-10-CM | POA: Diagnosis not present

## 2021-07-12 DIAGNOSIS — Z96652 Presence of left artificial knee joint: Secondary | ICD-10-CM | POA: Diagnosis not present

## 2021-07-12 DIAGNOSIS — M170 Bilateral primary osteoarthritis of knee: Secondary | ICD-10-CM | POA: Diagnosis not present

## 2021-07-12 DIAGNOSIS — M25461 Effusion, right knee: Secondary | ICD-10-CM | POA: Diagnosis not present

## 2021-11-11 ENCOUNTER — Other Ambulatory Visit: Payer: Self-pay | Admitting: Family Medicine

## 2021-11-11 DIAGNOSIS — N3281 Overactive bladder: Secondary | ICD-10-CM

## 2021-12-14 ENCOUNTER — Encounter: Payer: Self-pay | Admitting: Family Medicine

## 2021-12-14 DIAGNOSIS — L720 Epidermal cyst: Secondary | ICD-10-CM | POA: Diagnosis not present

## 2021-12-14 DIAGNOSIS — D229 Melanocytic nevi, unspecified: Secondary | ICD-10-CM | POA: Diagnosis not present

## 2021-12-14 DIAGNOSIS — L578 Other skin changes due to chronic exposure to nonionizing radiation: Secondary | ICD-10-CM | POA: Diagnosis not present

## 2021-12-14 DIAGNOSIS — D2339 Other benign neoplasm of skin of other parts of face: Secondary | ICD-10-CM | POA: Diagnosis not present

## 2021-12-14 DIAGNOSIS — D485 Neoplasm of uncertain behavior of skin: Secondary | ICD-10-CM | POA: Diagnosis not present

## 2021-12-29 ENCOUNTER — Encounter: Payer: Self-pay | Admitting: Family Medicine

## 2021-12-29 DIAGNOSIS — Z5181 Encounter for therapeutic drug level monitoring: Secondary | ICD-10-CM

## 2021-12-29 DIAGNOSIS — E042 Nontoxic multinodular goiter: Secondary | ICD-10-CM

## 2021-12-29 DIAGNOSIS — R7301 Impaired fasting glucose: Secondary | ICD-10-CM

## 2021-12-29 DIAGNOSIS — E7800 Pure hypercholesterolemia, unspecified: Secondary | ICD-10-CM

## 2021-12-31 NOTE — Telephone Encounter (Signed)
I entered orders as future orders.  Not sure if you can fax orders directly to the [facility] she is talking about or not.  You can release them in order to print/fax/mail.  Please let patient know the plan. Thanks ?

## 2022-01-01 ENCOUNTER — Other Ambulatory Visit: Payer: Self-pay | Admitting: *Deleted

## 2022-01-01 DIAGNOSIS — E042 Nontoxic multinodular goiter: Secondary | ICD-10-CM

## 2022-01-01 DIAGNOSIS — Z5181 Encounter for therapeutic drug level monitoring: Secondary | ICD-10-CM

## 2022-01-01 DIAGNOSIS — R7301 Impaired fasting glucose: Secondary | ICD-10-CM

## 2022-01-01 DIAGNOSIS — E7800 Pure hypercholesterolemia, unspecified: Secondary | ICD-10-CM

## 2022-01-02 DIAGNOSIS — M170 Bilateral primary osteoarthritis of knee: Secondary | ICD-10-CM | POA: Diagnosis not present

## 2022-01-03 ENCOUNTER — Encounter: Payer: Self-pay | Admitting: Family Medicine

## 2022-01-04 ENCOUNTER — Encounter: Payer: Self-pay | Admitting: *Deleted

## 2022-01-04 DIAGNOSIS — Z5181 Encounter for therapeutic drug level monitoring: Secondary | ICD-10-CM | POA: Diagnosis not present

## 2022-01-04 DIAGNOSIS — E7800 Pure hypercholesterolemia, unspecified: Secondary | ICD-10-CM | POA: Diagnosis not present

## 2022-01-04 DIAGNOSIS — R7301 Impaired fasting glucose: Secondary | ICD-10-CM | POA: Diagnosis not present

## 2022-01-04 DIAGNOSIS — E042 Nontoxic multinodular goiter: Secondary | ICD-10-CM | POA: Diagnosis not present

## 2022-01-05 LAB — COMPREHENSIVE METABOLIC PANEL
ALT: 32 IU/L (ref 0–32)
AST: 21 IU/L (ref 0–40)
Albumin/Globulin Ratio: 1.9 (ref 1.2–2.2)
Albumin: 4.7 g/dL (ref 3.8–4.8)
Alkaline Phosphatase: 72 IU/L (ref 44–121)
BUN/Creatinine Ratio: 33 — ABNORMAL HIGH (ref 12–28)
BUN: 25 mg/dL (ref 8–27)
Bilirubin Total: 0.6 mg/dL (ref 0.0–1.2)
CO2: 24 mmol/L (ref 20–29)
Calcium: 11.2 mg/dL — ABNORMAL HIGH (ref 8.7–10.3)
Chloride: 105 mmol/L (ref 96–106)
Creatinine, Ser: 0.76 mg/dL (ref 0.57–1.00)
Globulin, Total: 2.5 g/dL (ref 1.5–4.5)
Glucose: 101 mg/dL — ABNORMAL HIGH (ref 70–99)
Potassium: 5.2 mmol/L (ref 3.5–5.2)
Sodium: 141 mmol/L (ref 134–144)
Total Protein: 7.2 g/dL (ref 6.0–8.5)
eGFR: 86 mL/min/{1.73_m2} (ref >59)

## 2022-01-05 LAB — HEMOGLOBIN A1C
Est. average glucose Bld gHb Est-mCnc: 105 mg/dL
Hgb A1c MFr Bld: 5.3 % (ref 4.8–5.6)

## 2022-01-05 LAB — CBC WITH DIFFERENTIAL/PLATELET
Basophils Absolute: 0 10*3/uL (ref 0.0–0.2)
Basos: 0 %
EOS (ABSOLUTE): 0 10*3/uL (ref 0.0–0.4)
Eos: 1 %
Hematocrit: 44 % (ref 34.0–46.6)
Hemoglobin: 14.9 g/dL (ref 11.1–15.9)
Immature Grans (Abs): 0 10*3/uL (ref 0.0–0.1)
Immature Granulocytes: 0 %
Lymphocytes Absolute: 1.5 10*3/uL (ref 0.7–3.1)
Lymphs: 28 %
MCH: 30.9 pg (ref 26.6–33.0)
MCHC: 33.9 g/dL (ref 31.5–35.7)
MCV: 91 fL (ref 79–97)
Monocytes Absolute: 0.4 10*3/uL (ref 0.1–0.9)
Monocytes: 8 %
Neutrophils Absolute: 3.6 10*3/uL (ref 1.4–7.0)
Neutrophils: 63 %
Platelets: 272 10*3/uL (ref 150–450)
RBC: 4.82 x10E6/uL (ref 3.77–5.28)
RDW: 11.3 % — ABNORMAL LOW (ref 11.7–15.4)
WBC: 5.6 10*3/uL (ref 3.4–10.8)

## 2022-01-05 LAB — TSH: TSH: 0.458 u[IU]/mL (ref 0.450–4.500)

## 2022-01-05 LAB — LIPID PANEL
Chol/HDL Ratio: 2.8 ratio (ref 0.0–4.4)
Cholesterol, Total: 199 mg/dL (ref 100–199)
HDL: 70 mg/dL (ref >39)
LDL Chol Calc (NIH): 117 mg/dL — ABNORMAL HIGH (ref 0–99)
Triglycerides: 67 mg/dL (ref 0–149)
VLDL Cholesterol Cal: 12 mg/dL (ref 5–40)

## 2022-01-10 NOTE — Patient Instructions (Addendum)
?  HEALTH MAINTENANCE RECOMMENDATIONS: ? ?It is recommended that you get at least 30 minutes of aerobic exercise at least 5 days/week (for weight loss, you may need as much as 60-90 minutes). This can be any activity that gets your heart rate up. This can be divided in 10-15 minute intervals if needed, but try and build up your endurance at least once a week.  Weight bearing exercise is also recommended twice weekly. ? ?Eat a healthy diet with lots of vegetables, fruits and fiber.  "Colorful" foods have a lot of vitamins (ie green vegetables, tomatoes, red peppers, etc).  Limit sweet tea, regular sodas and alcoholic beverages, all of which has a lot of calories and sugar.  Up to 1 alcoholic drink daily may be beneficial for women (unless trying to lose weight, watch sugars).  Drink a lot of water. ? ?Calcium recommendations are 1200-1500 mg daily (1500 mg for postmenopausal women or women without ovaries), and vitamin D 1000 IU daily.  This should be obtained from diet and/or supplements (vitamins), and calcium should not be taken all at once, but in divided doses. ? ?Monthly self breast exams and yearly mammograms for women over the age of 15 is recommended. ? ?Sunscreen of at least SPF 30 should be used on all sun-exposed parts of the skin when outside between the hours of 10 am and 4 pm (not just when at beach or pool, but even with exercise, golf, tennis, and yard work!)  Use a sunscreen that says "broad spectrum" so it covers both UVA and UVB rays, and make sure to reapply every 1-2 hours. ? ?Remember to change the batteries in your smoke detectors when changing your clock times in the spring and fall. Carbon monoxide detectors are recommended for your home. ? ?Use your seat belt every time you are in a car, and please drive safely and not be distracted with cell phones and texting while driving. ? ? ?Ms. Paula Hughes , ?Thank you for taking time to come for your Medicare Wellness Visit. I appreciate your ongoing  commitment to your health goals. Please review the following plan we discussed and let me know if I can assist you in the future.  ? ?This is a list of the screening recommended for you and due dates:  ?Health Maintenance  ?Topic Date Due  ? Mammogram  12/21/2021  ? Colon Cancer Screening  08/13/2026  ? Tetanus Vaccine  06/29/2031  ? Pneumonia Vaccine  Completed  ? Flu Shot  Completed  ? DEXA scan (bone density measurement)  Completed  ? COVID-19 Vaccine  Completed  ? Hepatitis C Screening: USPSTF Recommendation to screen - Ages 71-79 yo.  Completed  ? Zoster (Shingles) Vaccine  Completed  ? HPV Vaccine  Aged Out  ? ?Please get yearly mammograms. ? ? ?Check with your GYN if you still have a cervix (ie had a supracervical hysterectomy, and cervix remains).  If your cervix was removed, then #1 you no longer need paps; #2 your pap smear (from the [facility] result shown to me on your phone) said it was a cervical specimen.  If it was a vaginal specimen, the lab needs to be notified and have it re-read. ? ? ?

## 2022-01-10 NOTE — Progress Notes (Signed)
?Chief Complaint  ?Patient presents with  ? Medicare Wellness  ?  Nonfasting AWV/CPE. No new concerns.   ? ? ?Paula Hughes is a 66 y.o. female who presents for Annual Medicare wellness visit, and follow-up on chronic medical conditions.  She had labs done prior to her visit, see below. She saw GYN in 11/2021.  She reports having pap repeated this year due to inconclusive results last year. She is s/p hysterectomy for fibroids, doesn't know if she still has a cervix. She is wondering why it keeps coming back "inconclusive". No results received.  ? ? ?She had Durolane injection of the R knee 01/02/22 by ortho.  She has noticed some improvement.  No longer catching when doing her core exercises since the injection. ? ?Last year she reported 2 episodes of her heart racing in the middle of the night (once summer 2021, once 11/2020), waking her up from sleep. Denied nightmare. ?Pulse was over 100 (but likely around 110, not much higher), and had a gradual decline; never occurred during the day or with exercise. No associated shortness of breath, felt just slightly tight in chest. ?She only had this occur once this year, at Christmastime, when in [employer] and had eaten a lot of [place] food.  She again woke up with it, lasted 10-15 minutes, improved with slow breathing (gradually decline in pulse). ? ?Overactive bladder: She continues to do well on Vesicare. This continues to be effective. Didn't do well with lower dose in the past. ?She had h/o frequent UTI's; she has been off of trimethoprim daily for prevention since 12/2018, with no recurrent infections (stopped after starting vaginal estrogen suppositories 2x/week from GYN).   ?Denies hot flashes (rare/mild). ? ?Low back pain--flares when she is more active.  She uses tylenol twice daily prn.  She previously used etodolac (long-term), stopped when she developed some GI symptoms, which resolved with use of prilosec and stopping NSAID. Etodolac was refilled for her last year, uses  rarely. She finished the prescription recently, only uses it when her back really bothers her (overdoing it in the [employer]). She is asking for refill.   ?She no longer has any GI issues (since using DGL). She continues to try and follow gluten-free and dairy-free diet (which she read about, based on her blood type), but not always strict about it. ?  ?Multinodular goiter:  Previously was under the care of Dr. Buddy Duty.  She reports that her nodules had remained stable in size x 5 years, didn't have symptoms, and didn't tolerate trial of suppressive therapy (didn't make a difference, didn't like how she felt)-- no longer sees endo. She denies any symptoms related to the goiter, has not increased in size (if anything, seems smaller). No changes to hair/skin/nails/energy/bowels. ?  ?Hyperlipidemia. In 2019 her lipids were notably higher than on prior checks (LDL went up to 136). She reported lots of sugar and chocolate at Christmas that year. ?She continues to eat 9 eggs/week, and red meat most nights--no change in diet, she reports same diet for years. Has salmon once a week. ?She has small amount of cheese with her eggs. ?+mayonnaise and butter in her diet. ?She had labs prior to visit, see below. ?  ?Immunization History  ?Administered Date(s) Administered  ? Hepatitis A, Adult 10/17/2017, 05/08/2018  ? Influenza Split 07/26/2011, 07/04/2012  ? Influenza, High Dose Seasonal PF 06/28/2021  ? Influenza-Unspecified 07/16/2015, 07/09/2017, 07/17/2018, 06/18/2019, 07/05/2020  ? Moderna Sars-Covid-2 Vaccination 08/16/2020, 01/16/2021  ? PFIZER(Purple Top)SARS-COV-2 Vaccination  01/07/2020, 01/27/2020  ? PNEUMOCOCCAL CONJUGATE-20 12/14/2020  ? Pension scheme manager 57yr & up 07/06/2021  ? Td 08/15/2004  ? Tdap 07/26/2011, 06/28/2021  ? Typhoid Live 10/18/2017  ? Zoster Recombinat (Shingrix) 12/26/2020, 04/03/2021  ? Zoster, Live 06/13/2016  ? ?Last Pap smear: per GYN (prior to hysterectomy). Pt states  she is still getting paps q3 years, done last year and was inconclusive, so was repeated this year ?Last mammogram: 11/28/20--additional imaging/views done 12/21/20. Only wants it every 2 years (due to issues with having chronic calcifications, recalls). She had recent normal breast exam by GYN, and does regular self-breast exams without concerns. ?[place]normal, recheck in 10 years ?Last DEXA: 02/2021 normal ?Dentist: twice yearly   ?Ophtho: yearly ?Exercise: 7 days/week elliptical (45 mins on 6 days, at least 15 mins on the 7th); Weights on MWF (using elastic bands and hand weights).  T/Th/Sat core exercises. ?  ?Vitamin D screen: 07/2011, normal at 46 ? ?Patient Care Team: ?KRita Ohara MD as PCP - General (Family Medicine) ?MBjorn Loser MD as Consulting Physician (Urology) ?NParticia Jasper MD as Referring Physician (Gastroenterology) ?SRosanne Ashing MD as Referring Physician (Orthopedic Surgery) ?[place]: [place]. [place]us [place] ?Dentist: Dr. WJerel Shepherd?Ophtho: Dr. YLynnae Prude?Derm: Dr. SNancy Fetter? ? ?[place] Scre[place]: [place] ?PHQ-2 Total Score 0  ? ?  ?  ? ?Falls screen:  ? ?  01/11/2022  ?  1:47 PM 12/14/2020  ?  2:14 PM 11/07/2016  ?  1:47 PM 06/09/2015  ?  2:51 PM  ?Fall Risk   ?Falls in the past year? 0 0 No No  ?Number falls in past yr: 0     ?Injury with Fall? 0     ?Risk for fall due to : No Fall Risks     ?Follow up Falls evaluation completed     ?  ? ?Functional Status Survey: ?Is the patient deaf or have difficulty hearing?: No ?Does the patient have difficulty seeing, even when wearing glasses/contacts?: No ?Does the patient have difficulty concentrating, remembering, or making decisions?: No ?Does the patient have difficulty walking or climbing stairs?: No ?Does the patient have difficulty dressing or bathing?: No ?Does the patient have difficulty  doing errands alone such as visiting a doctor's office or shopping?: No ? ?Mini-Cog Scoring: 5  ? ?End of Life Discussion:  Patient has a living will and medical power of attorney ? ? ?PMH, PSH, SH and FH were reviewed and updated ? ?Outpatient Encounter Medications as of 01/11/2022  ?Medication Sig Note  ? Acetaminophen (TYLENOL ARTHRITIS PAIN PO) Take 2 tablets by mouth in the morning and at bedtime.   ? Astaxanthin 4 MG CAPS Take 1 capsule by mouth daily. 09/14/2015: (for small capillaries of extremities, per pt)  ? Cobalamin Combinations (NEURIVA PLUS PO) Take 1 capsule by mouth daily.   ? Coenzyme Q10 (COQ10) 100 MG CAPS Take 100 mg by mouth daily.   ? CRANBERRY PO Take 2 tablets by mouth daily. 12/14/2020: W/D-Mannose '400mg'$  cranberry/'1000mg'$  D-Mannose  ? fexofenadine (ALLEGRA) 180 MG tablet Take 180 mg by mouth daily.   ? GARCINIA CAMBOGIA-CHROMIUM PO Take 2 capsules by mouth daily.   ? glucosamine-chondroitin 500-400 MG tablet Take 1 tablet by mouth 3 (three) times daily. 01/11/2022: Plus MSM  ? MAGNESIUM GLYCINATE PLUS PO Take 400 mg by mouth daily.   ? Melatonin 10 MG SUBL Place  under the tongue.   ? Multiple Vitamins-Minerals (IMMUNE SUPPORT VITAMIN C PO) Take by mouth. 12/02/2019: 5x/week  ? NON FORMULARY Take 1 tablet by mouth daily. 1/91/4782: Licorice Root Extract  ? Omega-3 Fatty Acids (FISH OIL) 1000 MG CAPS Take 1 capsule by mouth daily.   ? Psyllium (METAMUCIL PO) Take 5 capsules by mouth daily.   ? [DISCONTINUED] solifenacin (VESICARE) 10 MG tablet TAKE ONE TABLET BY MOUTH ONE TIME DAILY   ? etodolac (LODINE) 400 MG tablet Take 1 tablet (400 mg total) by mouth 2 (two) times daily as needed for moderate pain.   ? solifenacin (VESICARE) 10 MG tablet Take 1 tablet (10 mg total) by mouth daily.   ? [DISCONTINUED] conjugated estrogens (PREMARIN) vaginal cream Apply to urethral area once daily (vs intravaginally for 2 weeks then 2x/week long-term) 11/12/2018: Using suppositories 2x/week per GYN  ?  [DISCONTINUED] etodolac (LODINE) 400 MG tablet Take 1 tablet (400 mg total) by mouth 2 (two) times daily as needed for moderate pain. (Patient not taking: Reported on 01/11/2022) 01/11/2022: prn  ? ?No facility-admin

## 2022-01-11 ENCOUNTER — Encounter: Payer: Self-pay | Admitting: Family Medicine

## 2022-01-11 ENCOUNTER — Ambulatory Visit (INDEPENDENT_AMBULATORY_CARE_PROVIDER_SITE_OTHER): Payer: Medicare Other | Admitting: Family Medicine

## 2022-01-11 VITALS — BP 128/82 | HR 60 | Ht 69.75 in | Wt 193.4 lb

## 2022-01-11 DIAGNOSIS — N3281 Overactive bladder: Secondary | ICD-10-CM

## 2022-01-11 DIAGNOSIS — E042 Nontoxic multinodular goiter: Secondary | ICD-10-CM | POA: Diagnosis not present

## 2022-01-11 DIAGNOSIS — M545 Low back pain, unspecified: Secondary | ICD-10-CM

## 2022-01-11 DIAGNOSIS — E7800 Pure hypercholesterolemia, unspecified: Secondary | ICD-10-CM

## 2022-01-11 DIAGNOSIS — Z0000 Encounter for general adult medical examination without abnormal findings: Secondary | ICD-10-CM

## 2022-01-11 DIAGNOSIS — R7301 Impaired fasting glucose: Secondary | ICD-10-CM

## 2022-01-11 DIAGNOSIS — G8929 Other chronic pain: Secondary | ICD-10-CM

## 2022-01-11 LAB — POCT URINALYSIS DIP (PROADVANTAGE DEVICE)
Bilirubin, UA: NEGATIVE
Blood, UA: NEGATIVE
Glucose, UA: NEGATIVE mg/dL
Ketones, POC UA: NEGATIVE mg/dL
Leukocytes, UA: NEGATIVE
Nitrite, UA: NEGATIVE
Protein Ur, POC: NEGATIVE mg/dL
Specific Gravity, Urine: 1.01
Urobilinogen, Ur: NEGATIVE
pH, UA: 6 (ref 5.0–8.0)

## 2022-01-11 MED ORDER — SOLIFENACIN SUCCINATE 10 MG PO TABS: 10.0000 mg | ORAL_TABLET | Freq: Every day | ORAL | 3 refills | Status: DC

## 2022-01-11 MED ORDER — ETODOLAC 400 MG PO TABS: 400.0000 mg | ORAL_TABLET | Freq: Two times a day (BID) | ORAL | 0 refills | Status: DC | PRN

## 2022-01-12 ENCOUNTER — Encounter: Payer: Self-pay | Admitting: Family Medicine

## 2022-01-12 LAB — PTH, INTACT AND CALCIUM
Calcium: 11.4 mg/dL — ABNORMAL HIGH (ref 8.7–10.3)
PTH: 36 pg/mL (ref 15–65)

## 2022-01-12 LAB — PHOSPHORUS: Phosphorus: 3.4 mg/dL (ref 3.0–4.3)

## 2022-01-16 DIAGNOSIS — H43392 Other vitreous opacities, left eye: Secondary | ICD-10-CM | POA: Diagnosis not present

## 2022-02-07 DIAGNOSIS — Z471 Aftercare following joint replacement surgery: Secondary | ICD-10-CM | POA: Diagnosis not present

## 2022-02-07 DIAGNOSIS — Z96652 Presence of left artificial knee joint: Secondary | ICD-10-CM | POA: Diagnosis not present

## 2022-02-07 DIAGNOSIS — M25462 Effusion, left knee: Secondary | ICD-10-CM | POA: Diagnosis not present

## 2022-02-10 ENCOUNTER — Other Ambulatory Visit: Payer: Self-pay | Admitting: Family Medicine

## 2022-02-10 DIAGNOSIS — N3281 Overactive bladder: Secondary | ICD-10-CM

## 2022-02-13 DIAGNOSIS — M2041 Other hammer toe(s) (acquired), right foot: Secondary | ICD-10-CM | POA: Diagnosis not present

## 2022-02-13 DIAGNOSIS — M2042 Other hammer toe(s) (acquired), left foot: Secondary | ICD-10-CM | POA: Diagnosis not present

## 2022-02-14 ENCOUNTER — Other Ambulatory Visit: Payer: Self-pay | Admitting: Family Medicine

## 2022-02-14 DIAGNOSIS — N3281 Overactive bladder: Secondary | ICD-10-CM

## 2022-02-15 LAB — VITAMIN D 25 HYDROXY (VIT D DEFICIENCY, FRACTURES): Vit D, 25-Hydroxy: 28.8 ng/mL — ABNORMAL LOW (ref 30.0–100.0)

## 2022-02-15 LAB — CALCIUM, IONIZED: Calcium, Ion: 5.5 mg/dL (ref 4.5–5.6)

## 2022-02-15 LAB — CALCIUM: Calcium: 10.4 mg/dL — ABNORMAL HIGH (ref 8.7–10.3)

## 2022-02-22 DIAGNOSIS — M180 Bilateral primary osteoarthritis of first carpometacarpal joints: Secondary | ICD-10-CM | POA: Diagnosis not present

## 2022-03-20 LAB — HM PAP SMEAR: HM Pap smear: NORMAL

## 2022-05-10 NOTE — Progress Notes (Signed)
Order(s) created erroneously. Erroneous order ID: [id number]  Order canceled by: Ruben Reason  Order cancel date/time: 05/10/2022 4:09 PM

## 2022-05-12 ENCOUNTER — Other Ambulatory Visit: Payer: Self-pay | Admitting: Family Medicine

## 2022-05-12 DIAGNOSIS — N3281 Overactive bladder: Secondary | ICD-10-CM

## 2022-06-20 ENCOUNTER — Encounter: Payer: Self-pay | Admitting: Internal Medicine

## 2022-06-21 ENCOUNTER — Encounter: Payer: Self-pay | Admitting: Family Medicine

## 2022-06-21 ENCOUNTER — Encounter: Payer: Self-pay | Admitting: *Deleted

## 2022-08-06 ENCOUNTER — Encounter: Payer: Self-pay | Admitting: Family Medicine

## 2022-08-20 DIAGNOSIS — M7061 Trochanteric bursitis, right hip: Secondary | ICD-10-CM | POA: Diagnosis not present

## 2022-08-20 DIAGNOSIS — M25551 Pain in right hip: Secondary | ICD-10-CM | POA: Diagnosis not present

## 2022-08-31 DIAGNOSIS — Z9889 Other specified postprocedural states: Secondary | ICD-10-CM | POA: Diagnosis not present

## 2022-09-19 DIAGNOSIS — K580 Irritable bowel syndrome with diarrhea: Secondary | ICD-10-CM | POA: Diagnosis not present

## 2022-10-26 ENCOUNTER — Encounter: Payer: Self-pay | Admitting: Family Medicine

## 2022-10-29 ENCOUNTER — Other Ambulatory Visit: Payer: Self-pay | Admitting: *Deleted

## 2022-10-29 ENCOUNTER — Telehealth: Payer: Self-pay | Admitting: *Deleted

## 2022-10-29 DIAGNOSIS — M1711 Unilateral primary osteoarthritis, right knee: Secondary | ICD-10-CM

## 2022-10-29 DIAGNOSIS — M25561 Pain in right knee: Secondary | ICD-10-CM

## 2022-10-29 NOTE — Telephone Encounter (Signed)
You can email him and let him know that her MRI was ordered.  We will make sure that the results go to his office.

## 2022-10-29 NOTE — Telephone Encounter (Signed)
Dr. Carmin Richmond, MD (orthopedist) called and wanted to discuss patient with you . He is asking for you to order an MRI of the right knee. He stated that he does not participate with insurance and that is why he cannot order. She just recently established with him and he has a template you can use, he is emailing to me. I will print for you. He gave me his office number if you would call him back [phone] or he said if you would prefer to communicate through email that would be fine.

## 2022-11-01 DIAGNOSIS — H5203 Hypermetropia, bilateral: Secondary | ICD-10-CM | POA: Diagnosis not present

## 2022-11-01 DIAGNOSIS — H3560 Retinal hemorrhage, unspecified eye: Secondary | ICD-10-CM | POA: Diagnosis not present

## 2022-11-01 DIAGNOSIS — H04123 Dry eye syndrome of bilateral lacrimal glands: Secondary | ICD-10-CM | POA: Diagnosis not present

## 2022-11-01 DIAGNOSIS — H40013 Open angle with borderline findings, low risk, bilateral: Secondary | ICD-10-CM | POA: Diagnosis not present

## 2022-11-08 DIAGNOSIS — M1711 Unilateral primary osteoarthritis, right knee: Secondary | ICD-10-CM | POA: Diagnosis not present

## 2022-11-08 DIAGNOSIS — S83241A Other tear of medial meniscus, current injury, right knee, initial encounter: Secondary | ICD-10-CM | POA: Diagnosis not present

## 2022-11-08 DIAGNOSIS — S83281A Other tear of lateral meniscus, current injury, right knee, initial encounter: Secondary | ICD-10-CM | POA: Diagnosis not present

## 2022-11-08 DIAGNOSIS — M25561 Pain in right knee: Secondary | ICD-10-CM | POA: Diagnosis not present

## 2022-11-20 ENCOUNTER — Encounter: Payer: Self-pay | Admitting: Family Medicine

## 2022-11-21 DIAGNOSIS — Z1231 Encounter for screening mammogram for malignant neoplasm of breast: Secondary | ICD-10-CM | POA: Diagnosis not present

## 2022-11-21 DIAGNOSIS — R92333 Mammographic heterogeneous density, bilateral breasts: Secondary | ICD-10-CM | POA: Diagnosis not present

## 2022-11-21 LAB — HM MAMMOGRAPHY

## 2022-11-23 DIAGNOSIS — K580 Irritable bowel syndrome with diarrhea: Secondary | ICD-10-CM | POA: Diagnosis not present

## 2022-11-27 DIAGNOSIS — M180 Bilateral primary osteoarthritis of first carpometacarpal joints: Secondary | ICD-10-CM | POA: Diagnosis not present

## 2022-12-20 DIAGNOSIS — L578 Other skin changes due to chronic exposure to nonionizing radiation: Secondary | ICD-10-CM | POA: Diagnosis not present

## 2022-12-20 DIAGNOSIS — L309 Dermatitis, unspecified: Secondary | ICD-10-CM | POA: Diagnosis not present

## 2022-12-20 DIAGNOSIS — L570 Actinic keratosis: Secondary | ICD-10-CM | POA: Diagnosis not present

## 2023-01-16 ENCOUNTER — Ambulatory Visit: Payer: Medicare Other | Admitting: Family Medicine

## 2023-02-01 ENCOUNTER — Encounter: Payer: Self-pay | Admitting: Family Medicine

## 2023-02-01 DIAGNOSIS — E559 Vitamin D deficiency, unspecified: Secondary | ICD-10-CM

## 2023-02-01 DIAGNOSIS — R7301 Impaired fasting glucose: Secondary | ICD-10-CM

## 2023-02-01 DIAGNOSIS — E7800 Pure hypercholesterolemia, unspecified: Secondary | ICD-10-CM

## 2023-02-01 DIAGNOSIS — Z5181 Encounter for therapeutic drug level monitoring: Secondary | ICD-10-CM

## 2023-02-01 DIAGNOSIS — E042 Nontoxic multinodular goiter: Secondary | ICD-10-CM

## 2023-02-01 NOTE — Telephone Encounter (Signed)
Future orders were entered. Not sure how we have gotten them to the lab she goes to in the past (release orders and fax the lab sheets there?)  But orders are entered so you can figure out the best way. Thanks

## 2023-02-04 ENCOUNTER — Other Ambulatory Visit: Payer: Self-pay | Admitting: Internal Medicine

## 2023-02-04 DIAGNOSIS — R7301 Impaired fasting glucose: Secondary | ICD-10-CM

## 2023-02-04 DIAGNOSIS — E7800 Pure hypercholesterolemia, unspecified: Secondary | ICD-10-CM

## 2023-02-04 DIAGNOSIS — E042 Nontoxic multinodular goiter: Secondary | ICD-10-CM

## 2023-02-04 DIAGNOSIS — Z5181 Encounter for therapeutic drug level monitoring: Secondary | ICD-10-CM

## 2023-02-04 DIAGNOSIS — E559 Vitamin D deficiency, unspecified: Secondary | ICD-10-CM

## 2023-02-14 ENCOUNTER — Encounter: Payer: Self-pay | Admitting: Family Medicine

## 2023-02-14 DIAGNOSIS — N3281 Overactive bladder: Secondary | ICD-10-CM

## 2023-02-14 MED ORDER — SOLIFENACIN SUCCINATE 10 MG PO TABS: 10.0000 mg | ORAL_TABLET | Freq: Every day | ORAL | 0 refills | Status: DC

## 2023-02-15 DIAGNOSIS — R7301 Impaired fasting glucose: Secondary | ICD-10-CM | POA: Diagnosis not present

## 2023-02-15 DIAGNOSIS — E559 Vitamin D deficiency, unspecified: Secondary | ICD-10-CM | POA: Diagnosis not present

## 2023-02-15 DIAGNOSIS — E7800 Pure hypercholesterolemia, unspecified: Secondary | ICD-10-CM | POA: Diagnosis not present

## 2023-02-15 DIAGNOSIS — Z5181 Encounter for therapeutic drug level monitoring: Secondary | ICD-10-CM | POA: Diagnosis not present

## 2023-02-15 LAB — CBC WITH DIFFERENTIAL/PLATELET

## 2023-02-15 LAB — COMPREHENSIVE METABOLIC PANEL
BUN/Creatinine Ratio: 25 (ref 12–28)
Creatinine, Ser: 0.75 mg/dL (ref 0.57–1.00)
Total Protein: 6.1 g/dL (ref 6.0–8.5)

## 2023-02-15 LAB — LIPID PANEL
Chol/HDL Ratio: 2.8 ratio (ref 0.0–4.4)
Cholesterol, Total: 172 mg/dL (ref 100–199)
Triglycerides: 58 mg/dL (ref 0–149)

## 2023-02-15 LAB — VITAMIN D 25 HYDROXY (VIT D DEFICIENCY, FRACTURES)

## 2023-02-16 LAB — COMPREHENSIVE METABOLIC PANEL
ALT: 17 IU/L (ref 0–32)
AST: 20 IU/L (ref 0–40)
Albumin/Globulin Ratio: 4.1 — ABNORMAL HIGH (ref 1.2–2.2)
Albumin: 4.9 g/dL (ref 3.9–4.9)
Alkaline Phosphatase: 62 IU/L (ref 44–121)
BUN: 19 mg/dL (ref 8–27)
Bilirubin Total: 0.8 mg/dL (ref 0.0–1.2)
CO2: 25 mmol/L (ref 20–29)
Calcium: 10.5 mg/dL — ABNORMAL HIGH (ref 8.7–10.3)
Chloride: 101 mmol/L (ref 96–106)
Globulin, Total: 1.2 g/dL — ABNORMAL LOW (ref 1.5–4.5)
Glucose: 89 mg/dL (ref 70–99)
Potassium: 4.8 mmol/L (ref 3.5–5.2)
Sodium: 138 mmol/L (ref 134–144)
eGFR: 87 mL/min/{1.73_m2} (ref >59)

## 2023-02-16 LAB — CBC WITH DIFFERENTIAL/PLATELET
Basophils Absolute: 0.1 10*3/uL (ref 0.0–0.2)
Basos: 1 %
EOS (ABSOLUTE): 0.2 10*3/uL (ref 0.0–0.4)
Hematocrit: 44 % (ref 34.0–46.6)
Hemoglobin: 14.8 g/dL (ref 11.1–15.9)
Immature Granulocytes: 0 %
Lymphs: 38 %
MCH: 31 pg (ref 26.6–33.0)
Neutrophils Absolute: 1.9 10*3/uL (ref 1.4–7.0)
Platelets: 240 10*3/uL (ref 150–450)
RBC: 4.77 x10E6/uL (ref 3.77–5.28)
RDW: 10.9 % — ABNORMAL LOW (ref 11.7–15.4)
WBC: 4.3 10*3/uL (ref 3.4–10.8)

## 2023-02-16 LAB — TSH: TSH: 1.1 u[IU]/mL (ref 0.450–4.500)

## 2023-02-16 LAB — LIPID PANEL
HDL: 61 mg/dL (ref >39)
LDL Chol Calc (NIH): 100 mg/dL — ABNORMAL HIGH (ref 0–99)
VLDL Cholesterol Cal: 11 mg/dL (ref 5–40)

## 2023-02-20 NOTE — Patient Instructions (Incomplete)
  HEALTH MAINTENANCE RECOMMENDATIONS:  It is recommended that you get at least 30 minutes of aerobic exercise at least 5 days/week (for weight loss, you may need as much as 60-90 minutes). This can be any activity that gets your heart rate up. This can be divided in 10-15 minute intervals if needed, but try and build up your endurance at least once a week.  Weight bearing exercise is also recommended twice weekly.  Eat a healthy diet with lots of vegetables, fruits and fiber.  "Colorful" foods have a lot of vitamins (ie green vegetables, tomatoes, red peppers, etc).  Limit sweet tea, regular sodas and alcoholic beverages, all of which has a lot of calories and sugar.  Up to 1 alcoholic drink daily may be beneficial for women (unless trying to lose weight, watch sugars).  Drink a lot of water.  Calcium recommendations are 1200-1500 mg daily (1500 mg for postmenopausal women or women without ovaries), and vitamin D 1000 IU daily.  This should be obtained from diet and/or supplements (vitamins), and calcium should not be taken all at once, but in divided doses.  Monthly self breast exams and yearly mammograms for women over the age of 68 is recommended.  Sunscreen of at least SPF 30 should be used on all sun-exposed parts of the skin when outside between the hours of 10 am and 4 pm (not just when at beach or pool, but even with exercise, golf, tennis, and yard work!)  Use a sunscreen that says "broad spectrum" so it covers both UVA and UVB rays, and make sure to reapply every 1-2 hours.  Remember to change the batteries in your smoke detectors when changing your clock times in the spring and fall. Carbon monoxide detectors are recommended for your home.  Use your seat belt every time you are in a car, and please drive safely and not be distracted with cell phones and texting while driving.   Paula Hughes , Thank you for taking time to come for your Medicare Wellness Visit. I appreciate your ongoing  commitment to your health goals. Please review the following plan we discussed and let me know if I can assist you in the future.   This is a list of the screening recommended for you and due dates:  Health Maintenance  Topic Date Due   Mammogram  12/21/2021   COVID-19 Vaccine (7 - 2023-24 season) 08/28/2022   Flu Shot  05/16/2023   Medicare Annual Wellness Visit  02/21/2024   Colon Cancer Screening  08/13/2026   DTaP/Tdap/Td vaccine (4 - Td or Tdap) 06/29/2031   Pneumonia Vaccine  Completed   DEXA scan (bone density measurement)  Completed   Hepatitis C Screening: USPSTF Recommendation to screen - Ages 12-79 yo.  Completed   Zoster (Shingles) Vaccine  Completed   HPV Vaccine  Aged Out   Ignore the date above--the mammogram from [facility] in 11/2022 has to manually be entered into your [facility] chart (not yet done).  You are up to date. Continue yearly mammograms (next due 11/2023).  I recommend a COVID booster when convenient for you.

## 2023-02-20 NOTE — Progress Notes (Unsigned)
No chief complaint on file.   Paula Hughes is a 67 y.o. female who presents for annual physical, Medicare wellness visit, and follow-up on chronic medical conditions.  She had labs done prior to her visit, see below.   Concerns?  Right knee pain MRI R knee in 10/2022.   IMPRESSION:  1. Medial and lateral meniscal tears.  2.  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  3.  Ganglion cyst formation along the posterior medial knee and proximal calf soft tissues.   Bilateral thumb CMC osteoarthritis, Right worse than left. L is tolerable.  She had R CMC injected 02/2022, and again in 11/2022.  Low back pain--flares when she is more active.  She uses tylenol twice daily prn.  She previously used etodolac (long-term), stopped when she developed some GI symptoms, which resolved with use of prilosec and stopping NSAID. She uses Etodolac only when her back really bothers her (overdoing it in the [employer]). Last rx'd for #60 in 12/2021.  08/2022 she was diagnosed with R trochanteric bursitis. This was treated with a cortisone injection and a course of meloxicam.   IBS with diarrhea:  She last saw GI in 11/2022.  She had been having episodes of fecal incontinence a few times/year. Pelvic floor PT was recommended, but declined.  She is s/p cholecystectomy.  She tried Colestid, but this caused a lot of gas. Bentyl caused constipation. She was advised to stop Magnesium supplement, and to use imodium up to BID prn.  At her physical last year, she reported: She no longer has any GI issues (since using DGL). She continues to try and follow gluten-free and dairy-free diet (which she read about, based on her blood type), but not always strict about it.    Overactive bladder: She continues to do well on Vesicare. This continues to be effective. Didn't do well with lower dose in the past. She had h/o frequent UTI's; she has been off of trimethoprim daily for prevention since 12/2018,  with no recurrent infections (stopped after starting vaginal estrogen suppositories 2x/week from GYN).   Denies hot flashes (rare/mild).  Multinodular goiter:  Previously was under the care of Dr. Sharl Ma.  She reports that her nodules had remained stable in size x 5 years, didn't have symptoms, and didn't tolerate trial of suppressive therapy (didn't make a difference, didn't like how she felt)-- no longer sees endo. She denies any symptoms related to the goiter, has not increased in size (if anything, seems smaller). No changes to hair/skin/nails/energy/bowels.   Hyperlipidemia. In 2019 her lipids were notably higher than on prior checks (LDL went up to 136). She reported lots of sugar and chocolate at Christmas that year. She continues to eat 9 eggs/week, and red meat most nights--no change in diet, she reports same diet for years. Has salmon once a week. She has small amount of cheese with her eggs. +mayonnaise and butter in her diet. She had labs prior to visit, see below.  Vitamin D deficiency: When level was a little low last year, it was recommended that she take 1000 IU of D3 during the winter months.  She has had some elevations in calcium in the past. She is currently taking   Component Ref Range & Units 5 d ago 1 yr ago 11 yr ago  Vit D, 25-Hydroxy 30.0 - 100.0 ng/mL 29.0 Low  28.8 Low  CM 46 R, CM     Immunization History  Administered Date(s) Administered   Hepatitis A, Adult  10/17/2017, 05/08/2018   Influenza Split 07/26/2011, 07/04/2012   Influenza, High Dose Seasonal PF 06/28/2021, 06/21/2022   Influenza-Unspecified 07/16/2015, 07/09/2017, 07/17/2018, 06/18/2019, 07/05/2020   Moderna Sars-Covid-2 Vaccination 08/16/2020, 01/16/2021   PFIZER Comirnaty(Gray Top)Covid-19 Tri-Sucrose Vaccine 07/03/2022   PFIZER(Purple Top)SARS-COV-2 Vaccination 01/07/2020, 01/27/2020   PNEUMOCOCCAL CONJUGATE-20 12/14/2020   Pfizer Covid-19 Vaccine Bivalent Booster 2yrs & up 07/06/2021    Respiratory Syncytial Virus Vaccine,Recomb Aduvanted(Arexvy) 06/21/2022   Td 08/15/2004   Tdap 07/26/2011, 06/28/2021   Typhoid Live 10/18/2017   Zoster Recombinat (Shingrix) 12/26/2020, 04/03/2021   Zoster, Live 06/13/2016   Last Pap smear: s/p hysterectomy.  Last pap was 03/2022, normal. Last mammogram: 11/2022 Last colonoscopy:  Had at [facility] in [place] 08/13/16--normal, recheck in 10 years Last DEXA: 02/2021 normal Dentist: twice yearly   Ophtho: yearly Exercise:  7 days/week elliptical (45 mins on 6 days, at least 15 mins on the 7th); Weights on MWF (using elastic bands and hand weights).  T/Th/Sat core exercises.   Patient Care Team: Joselyn Arrow, MD as PCP - General (Family Medicine) Alfredo Martinez, MD as Consulting Physician (Urology) Cephus Shelling, MD as Referring Physician (Gastroenterology) Molly Maduro, MD as Referring Physician (Orthopedic Surgery) GYN: Dr. Elio Forget [name] in [place]) Dentist: Dr. Alvina Filbert Ophtho: Dr. Carlena Hurl: Dr. Wynelle Link  GI: Dr. Edythe Clarity Ortho: Dr. Aretha Parrot (hand)    Depression Screening: Flowsheet Row Office Visit from 01/11/2022 in [facility]  PHQ-2 Total Score 0        Falls screen:     01/11/2022    1:47 PM 12/14/2020    2:14 PM 11/07/2016    1:47 PM 06/09/2015    2:51 PM  Fall Risk   Falls in the past year? 0 0 No No  Number falls in past yr: 0     Injury with Fall? 0     Risk for fall due to : No Fall Risks     Follow up Falls evaluation completed        Functional Status Survey:        End of Life Discussion:  Patient has a living will and medical power of attorney   PMH, PSH, SH and FH were reviewed and updated   ROS: The patient denies anorexia, fever, headaches, vision changes, decreased hearing, ear pain, sore throat, dizziness, syncope, dyspnea on exertion, cough, swelling, nausea, vomiting, diarrhea, constipation, abdominal pain, melena,  hematochezia, hematuria, incontinence (controlled with meds), dysuria, vaginal bleeding, discharge, odor or itch, genital lesions, numbness, tingling, weakness, tremor, suspicious skin lesions, depression, anxiety, abnormal bleeding/bruising, or enlarged lymph nodes.  Mild/rare hot flashes, no longer needs any supplements. Chronic low back pain, controlled with tylenol (only rarely need lodine) Allergies controlled with allegra. Some knee stiffness in the mornings, mild pain in L knee. Under care of ortho. Up 2-3x/night to void, unchanged. Denies dysuria, incontinence    PHYSICAL EXAM:  There were no vitals taken for this visit.  Wt Readings from Last 3 Encounters:  01/11/22 193 lb 6.4 oz (87.7 kg)  12/14/20 197 lb (89.4 kg)  12/02/19 200 lb 6.4 oz (90.9 kg)    General Appearance:   Alert, cooperative, no distress, appears stated age    Head:   Normocephalic, without obvious abnormality, atraumatic    Eyes:   PERRL, conjunctiva/corneas clear, EOM's intact, fundi benign    Ears:   Normal TM's and external ear canals    Nose:   Normal, no drainage or sinus tenderness  Throat:  No erythema or lesions  Neck:   Supple, no lymphadenopathy; thyroid: multinodular goiter noted, more prominent on right, unchanged; no carotid bruit or JVD    Back:   Spine nontender, no curvature, ROM normal, no CVA tenderness    Lungs:   Clear to auscultation bilaterally without wheezes, rales or ronchi; respirations unlabored    Chest Wall:   No tenderness or deformity    Heart:   Regular rate and rhythm, S1 and S2 normal, no murmur, rub or gallop    Breast Exam:   Deferred to GYN    Abdomen:   Soft, non-tender, nondistended, normoactive bowel sounds, no masses, no hepatosplenomegaly    Genitalia:   deferred to GYN    Rectal:   Deferred to GYN    Extremities:   No clubbing, cyanosis or edema. Hammertoe at R 2nd toe.  Small nodule noted in right upper thigh (nontender, present x 20 years per pt without  change)  Pulses:   2+ and symmetric all extremities    Skin:   Skin color, texture, turgor are normal. No suspicious lesions.  Lymph nodes:   Cervical, supraclavicular, inguinal nodes normal    Neurologic:   Normal strength, sensation and gait; reflexes 2+ and symmetric throughout                   Psych:   Normal mood, affect, hygiene and grooming   ***UPDATE R 2nd hammertoe, nodule R thigh??     Chemistry      Component Value Date/Time   NA 138 02/15/2023 0908   K 4.8 02/15/2023 0908   CL 101 02/15/2023 0908   CO2 25 02/15/2023 0908   BUN 19 02/15/2023 0908   CREATININE 0.75 02/15/2023 0908   CREATININE 0.76 10/17/2017 0938      Component Value Date/Time   CALCIUM 10.5 (H) 02/15/2023 0908   ALKPHOS 62 02/15/2023 0908   AST 20 02/15/2023 0908   ALT 17 02/15/2023 0908   BILITOT 0.8 02/15/2023 0908     Fasting glucose 89 Vitamin D-OH 29.0  Lab Results  Component Value Date   CHOL 172 02/15/2023   HDL 61 02/15/2023   LDLCALC 100 (H) 02/15/2023   TRIG 58 02/15/2023   CHOLHDL 2.8 02/15/2023   Lab Results  Component Value Date   WBC 4.3 02/15/2023   HGB 14.8 02/15/2023   HCT 44.0 02/15/2023   MCV 92 02/15/2023   PLT 240 02/15/2023   Lab Results  Component Value Date   TSH 1.100 02/15/2023    ASSESSMENT/PLAN:  Covid booster? Abstract mammo from [facility] (in care everywhere) from 11/2022  Low D  Etodolac rf? (Vesicare RF last week x 90d)  Discussed monthly self breast exams and yearly mammograms (pt prefers every other year); at least 30 minutes of aerobic activity at least 5 days/week, weight-bearing exercise 2x/week; proper sunscreen use reviewed; healthy diet, including goals of calcium and vitamin D intake and alcohol recommendations (less than or equal to 1 drink/day) reviewed; regular seatbelt use; changing batteries in smoke detectors.  Immunization recommendations discussed--continue yearly flu shots (high dose). To follow the news regarding future COVID  booster recommendations. Colonoscopy recommendations reviewed--UTD.   MOST form completed.  Full Code, Full Care.  F/u 1 year, sooner prn.   Medicare Attestation I have personally reviewed: The patient's medical and social history Their use of alcohol, tobacco or illicit drugs Their current medications and supplements The patient's functional ability including ADLs,fall risks, home safety risks, cognitive, and  hearing and visual impairment Diet and physical activities Evidence for depression or mood disorders  The patient's weight, height, BMI have been recorded in the chart.  I have made referrals, counseling, and provided education to the patient based on review of the above and I have provided the patient with a written personalized care plan for preventive services.     Lavonda Jumbo, MD

## 2023-02-21 ENCOUNTER — Encounter: Payer: Self-pay | Admitting: Family Medicine

## 2023-02-21 ENCOUNTER — Ambulatory Visit (INDEPENDENT_AMBULATORY_CARE_PROVIDER_SITE_OTHER): Payer: Medicare Other | Admitting: Family Medicine

## 2023-02-21 VITALS — BP 120/70 | HR 68 | Ht 69.0 in | Wt 196.6 lb

## 2023-02-21 DIAGNOSIS — Z0000 Encounter for general adult medical examination without abnormal findings: Secondary | ICD-10-CM

## 2023-02-21 DIAGNOSIS — M545 Low back pain, unspecified: Secondary | ICD-10-CM

## 2023-02-21 DIAGNOSIS — E559 Vitamin D deficiency, unspecified: Secondary | ICD-10-CM | POA: Diagnosis not present

## 2023-02-21 DIAGNOSIS — N3281 Overactive bladder: Secondary | ICD-10-CM

## 2023-02-21 DIAGNOSIS — E042 Nontoxic multinodular goiter: Secondary | ICD-10-CM | POA: Diagnosis not present

## 2023-02-21 DIAGNOSIS — M1711 Unilateral primary osteoarthritis, right knee: Secondary | ICD-10-CM

## 2023-02-21 DIAGNOSIS — K580 Irritable bowel syndrome with diarrhea: Secondary | ICD-10-CM

## 2023-02-21 DIAGNOSIS — Z6829 Body mass index (BMI) 29.0-29.9, adult: Secondary | ICD-10-CM

## 2023-02-21 DIAGNOSIS — G8929 Other chronic pain: Secondary | ICD-10-CM | POA: Diagnosis not present

## 2023-02-21 MED ORDER — ETODOLAC 400 MG PO TABS
400.0000 mg | ORAL_TABLET | Freq: Every day | ORAL | 0 refills | Status: AC | PRN
Start: 2023-02-21 — End: ?

## 2023-02-22 DIAGNOSIS — H5203 Hypermetropia, bilateral: Secondary | ICD-10-CM | POA: Diagnosis not present

## 2023-02-22 DIAGNOSIS — H3560 Retinal hemorrhage, unspecified eye: Secondary | ICD-10-CM | POA: Diagnosis not present

## 2023-02-22 DIAGNOSIS — H04123 Dry eye syndrome of bilateral lacrimal glands: Secondary | ICD-10-CM | POA: Diagnosis not present

## 2023-02-22 DIAGNOSIS — H40013 Open angle with borderline findings, low risk, bilateral: Secondary | ICD-10-CM | POA: Diagnosis not present

## 2023-02-25 ENCOUNTER — Encounter: Payer: Self-pay | Admitting: *Deleted

## 2023-02-25 NOTE — Progress Notes (Signed)
Done

## 2023-04-26 DIAGNOSIS — M180 Bilateral primary osteoarthritis of first carpometacarpal joints: Secondary | ICD-10-CM | POA: Diagnosis not present

## 2023-05-07 ENCOUNTER — Encounter: Payer: Self-pay | Admitting: Family Medicine

## 2023-05-11 ENCOUNTER — Other Ambulatory Visit: Payer: Self-pay | Admitting: Family Medicine

## 2023-05-11 DIAGNOSIS — N3281 Overactive bladder: Secondary | ICD-10-CM

## 2023-05-20 ENCOUNTER — Other Ambulatory Visit: Payer: Self-pay | Admitting: Family Medicine

## 2023-05-20 DIAGNOSIS — G8929 Other chronic pain: Secondary | ICD-10-CM

## 2023-05-20 NOTE — Telephone Encounter (Signed)
Is this okay to refill>? 

## 2023-05-20 NOTE — Telephone Encounter (Signed)
Ok if she still needs it.  Verify if still needs it daily (since she had her knee procedure)

## 2023-05-20 NOTE — Telephone Encounter (Signed)
Patient does use, but for her back pain. She uses sparingly and is ok for now.

## 2023-05-22 DIAGNOSIS — M1811 Unilateral primary osteoarthritis of first carpometacarpal joint, right hand: Secondary | ICD-10-CM | POA: Diagnosis not present

## 2023-06-08 NOTE — Progress Notes (Unsigned)
Start time: End time:  Virtual Visit via Video Note  I connected with Paula Hughes on 06/08/23 by a video enabled telemedicine application and verified that I am speaking with the correct person using two identifiers.  Location: Patient: *** Provider: office   I discussed the limitations of evaluation and management by telemedicine and the availability of in person appointments. The patient expressed understanding and agreed to proceed.  History of Present Illness:  No chief complaint on file.  She sent message 7/23 stating that the anesthesiologist (from her knee stem cell injections) mentioned she had sleep apnea and should be tested. She has a friend who used [facility] out of [place] for a home sleep test. She presents today to discuss testing, symptoms.    Planning surgery R thumb (CMC joint) in October    Observations/Objective:  There were no vitals taken for this visit.   Assessment and Plan:   Is there an Epworth Sleepiness Scale in Epic for you to ask her those questions??    Follow Up Instructions:    I discussed the assessment and treatment plan with the patient. The patient was provided an opportunity to ask questions and all were answered. The patient agreed with the plan and demonstrated an understanding of the instructions.   The patient was advised to call back or seek an in-person evaluation if the symptoms worsen or if the condition fails to improve as anticipated.  I spent *** minutes dedicated to the care of this patient, including pre-visit review of records, face to face time, post-visit ordering of testing and documentation.    Lavonda Jumbo, MD

## 2023-06-10 ENCOUNTER — Ambulatory Visit (INDEPENDENT_AMBULATORY_CARE_PROVIDER_SITE_OTHER): Payer: Medicare Other | Admitting: Family Medicine

## 2023-06-10 ENCOUNTER — Encounter: Payer: Self-pay | Admitting: Family Medicine

## 2023-06-10 VITALS — BP 118/70 | HR 60 | Ht 69.0 in | Wt 203.4 lb

## 2023-06-10 DIAGNOSIS — G4730 Sleep apnea, unspecified: Secondary | ICD-10-CM

## 2023-06-10 DIAGNOSIS — H9313 Tinnitus, bilateral: Secondary | ICD-10-CM

## 2023-06-10 DIAGNOSIS — M25561 Pain in right knee: Secondary | ICD-10-CM

## 2023-06-10 DIAGNOSIS — M1811 Unilateral primary osteoarthritis of first carpometacarpal joint, right hand: Secondary | ICD-10-CM

## 2023-06-18 DIAGNOSIS — R0602 Shortness of breath: Secondary | ICD-10-CM | POA: Diagnosis not present

## 2023-06-18 DIAGNOSIS — G4733 Obstructive sleep apnea (adult) (pediatric): Secondary | ICD-10-CM | POA: Diagnosis not present

## 2023-06-21 DIAGNOSIS — G4733 Obstructive sleep apnea (adult) (pediatric): Secondary | ICD-10-CM | POA: Diagnosis not present

## 2023-06-21 DIAGNOSIS — R0602 Shortness of breath: Secondary | ICD-10-CM | POA: Diagnosis not present

## 2023-07-17 ENCOUNTER — Encounter: Payer: Self-pay | Admitting: Family Medicine

## 2023-07-18 DIAGNOSIS — M25562 Pain in left knee: Secondary | ICD-10-CM | POA: Diagnosis not present

## 2023-07-18 DIAGNOSIS — Z96652 Presence of left artificial knee joint: Secondary | ICD-10-CM | POA: Diagnosis not present

## 2023-07-18 DIAGNOSIS — Z96653 Presence of artificial knee joint, bilateral: Secondary | ICD-10-CM | POA: Diagnosis not present

## 2023-07-18 DIAGNOSIS — M25561 Pain in right knee: Secondary | ICD-10-CM | POA: Diagnosis not present

## 2023-07-18 DIAGNOSIS — Z471 Aftercare following joint replacement surgery: Secondary | ICD-10-CM | POA: Diagnosis not present

## 2023-07-18 NOTE — Telephone Encounter (Signed)
Patient is not symptomatic and would prefer not to do anything at the present time.

## 2023-07-18 NOTE — Telephone Encounter (Signed)
How did this get scanned in without being signed off by me? I never got this!  It shows MILD OSA.  This can be treated only if symptomatic (unrefreshed sleep, excessive daytime sleepiness, insomnia, or if has hypertension, ischemic heart disease or h/o stroke) .  Doesn't HAVE to be treated otherwise. Avoiding sleeping on back is recommended. Treatment can include CPAP, or she can consider an oral appliance.  Advise of results and see how she wants to proceed.  If she is symptomatic, and wants CPAP, okay to order (autotitration)

## 2023-08-01 DIAGNOSIS — R350 Frequency of micturition: Secondary | ICD-10-CM | POA: Diagnosis not present

## 2023-08-06 ENCOUNTER — Encounter: Payer: Self-pay | Admitting: Family Medicine

## 2023-08-13 DIAGNOSIS — M180 Bilateral primary osteoarthritis of first carpometacarpal joints: Secondary | ICD-10-CM | POA: Diagnosis not present

## 2023-08-13 DIAGNOSIS — Z882 Allergy status to sulfonamides status: Secondary | ICD-10-CM | POA: Diagnosis not present

## 2023-08-13 DIAGNOSIS — G8918 Other acute postprocedural pain: Secondary | ICD-10-CM | POA: Diagnosis not present

## 2023-08-13 DIAGNOSIS — G4733 Obstructive sleep apnea (adult) (pediatric): Secondary | ICD-10-CM | POA: Diagnosis not present

## 2023-08-13 DIAGNOSIS — G4730 Sleep apnea, unspecified: Secondary | ICD-10-CM | POA: Diagnosis not present

## 2023-08-13 DIAGNOSIS — Z888 Allergy status to other drugs, medicaments and biological substances status: Secondary | ICD-10-CM | POA: Diagnosis not present

## 2023-08-13 DIAGNOSIS — Z886 Allergy status to analgesic agent status: Secondary | ICD-10-CM | POA: Diagnosis not present

## 2023-08-13 DIAGNOSIS — Z79899 Other long term (current) drug therapy: Secondary | ICD-10-CM | POA: Diagnosis not present

## 2023-08-13 DIAGNOSIS — M1811 Unilateral primary osteoarthritis of first carpometacarpal joint, right hand: Secondary | ICD-10-CM | POA: Diagnosis not present

## 2023-08-13 DIAGNOSIS — Z91048 Other nonmedicinal substance allergy status: Secondary | ICD-10-CM | POA: Diagnosis not present

## 2023-08-13 DIAGNOSIS — Z881 Allergy status to other antibiotic agents status: Secondary | ICD-10-CM | POA: Diagnosis not present

## 2023-08-13 HISTORY — PX: OTHER SURGICAL HISTORY: SHX169

## 2023-08-22 DIAGNOSIS — M25641 Stiffness of right hand, not elsewhere classified: Secondary | ICD-10-CM | POA: Diagnosis not present

## 2023-08-22 DIAGNOSIS — M1811 Unilateral primary osteoarthritis of first carpometacarpal joint, right hand: Secondary | ICD-10-CM | POA: Diagnosis not present

## 2023-08-29 DIAGNOSIS — M1811 Unilateral primary osteoarthritis of first carpometacarpal joint, right hand: Secondary | ICD-10-CM | POA: Diagnosis not present

## 2023-08-29 DIAGNOSIS — M25641 Stiffness of right hand, not elsewhere classified: Secondary | ICD-10-CM | POA: Diagnosis not present

## 2023-09-06 ENCOUNTER — Encounter: Payer: Self-pay | Admitting: *Deleted

## 2023-09-10 DIAGNOSIS — M25641 Stiffness of right hand, not elsewhere classified: Secondary | ICD-10-CM | POA: Diagnosis not present

## 2023-09-10 DIAGNOSIS — M1811 Unilateral primary osteoarthritis of first carpometacarpal joint, right hand: Secondary | ICD-10-CM | POA: Diagnosis not present

## 2023-09-24 DIAGNOSIS — M25641 Stiffness of right hand, not elsewhere classified: Secondary | ICD-10-CM | POA: Diagnosis not present

## 2023-09-24 DIAGNOSIS — M1811 Unilateral primary osteoarthritis of first carpometacarpal joint, right hand: Secondary | ICD-10-CM | POA: Diagnosis not present

## 2023-10-03 DIAGNOSIS — R350 Frequency of micturition: Secondary | ICD-10-CM | POA: Diagnosis not present

## 2023-10-07 DIAGNOSIS — M180 Bilateral primary osteoarthritis of first carpometacarpal joints: Secondary | ICD-10-CM | POA: Diagnosis not present

## 2023-10-07 DIAGNOSIS — Z4789 Encounter for other orthopedic aftercare: Secondary | ICD-10-CM | POA: Diagnosis not present

## 2023-10-14 DIAGNOSIS — M1811 Unilateral primary osteoarthritis of first carpometacarpal joint, right hand: Secondary | ICD-10-CM | POA: Diagnosis not present

## 2023-10-14 DIAGNOSIS — M25641 Stiffness of right hand, not elsewhere classified: Secondary | ICD-10-CM | POA: Diagnosis not present

## 2023-10-23 DIAGNOSIS — M1811 Unilateral primary osteoarthritis of first carpometacarpal joint, right hand: Secondary | ICD-10-CM | POA: Diagnosis not present

## 2023-10-23 DIAGNOSIS — M25641 Stiffness of right hand, not elsewhere classified: Secondary | ICD-10-CM | POA: Diagnosis not present

## 2023-11-07 DIAGNOSIS — L400 Psoriasis vulgaris: Secondary | ICD-10-CM | POA: Diagnosis not present

## 2023-11-07 DIAGNOSIS — L570 Actinic keratosis: Secondary | ICD-10-CM | POA: Diagnosis not present

## 2023-11-14 DIAGNOSIS — M25641 Stiffness of right hand, not elsewhere classified: Secondary | ICD-10-CM | POA: Diagnosis not present

## 2023-11-14 DIAGNOSIS — M1811 Unilateral primary osteoarthritis of first carpometacarpal joint, right hand: Secondary | ICD-10-CM | POA: Diagnosis not present

## 2023-11-18 DIAGNOSIS — M25449 Effusion, unspecified hand: Secondary | ICD-10-CM | POA: Diagnosis not present

## 2023-12-05 DIAGNOSIS — Z4789 Encounter for other orthopedic aftercare: Secondary | ICD-10-CM | POA: Diagnosis not present

## 2023-12-05 DIAGNOSIS — M79644 Pain in right finger(s): Secondary | ICD-10-CM | POA: Diagnosis not present

## 2023-12-05 DIAGNOSIS — G8929 Other chronic pain: Secondary | ICD-10-CM | POA: Diagnosis not present

## 2023-12-23 DIAGNOSIS — D229 Melanocytic nevi, unspecified: Secondary | ICD-10-CM | POA: Diagnosis not present

## 2023-12-23 DIAGNOSIS — L400 Psoriasis vulgaris: Secondary | ICD-10-CM | POA: Diagnosis not present

## 2023-12-23 DIAGNOSIS — L578 Other skin changes due to chronic exposure to nonionizing radiation: Secondary | ICD-10-CM | POA: Diagnosis not present

## 2023-12-30 DIAGNOSIS — M180 Bilateral primary osteoarthritis of first carpometacarpal joints: Secondary | ICD-10-CM | POA: Diagnosis not present

## 2023-12-30 DIAGNOSIS — Z4789 Encounter for other orthopedic aftercare: Secondary | ICD-10-CM | POA: Diagnosis not present

## 2023-12-30 DIAGNOSIS — M25512 Pain in left shoulder: Secondary | ICD-10-CM | POA: Diagnosis not present

## 2023-12-30 DIAGNOSIS — M7918 Myalgia, other site: Secondary | ICD-10-CM | POA: Diagnosis not present

## 2024-02-10 DIAGNOSIS — Z4789 Encounter for other orthopedic aftercare: Secondary | ICD-10-CM | POA: Diagnosis not present

## 2024-02-14 DIAGNOSIS — J0140 Acute pansinusitis, unspecified: Secondary | ICD-10-CM | POA: Diagnosis not present

## 2024-02-14 DIAGNOSIS — R051 Acute cough: Secondary | ICD-10-CM | POA: Diagnosis not present

## 2024-02-19 LAB — HM PAP SMEAR

## 2024-03-04 DIAGNOSIS — R350 Frequency of micturition: Secondary | ICD-10-CM | POA: Diagnosis not present

## 2024-03-24 ENCOUNTER — Encounter: Payer: Self-pay | Admitting: Family Medicine

## 2024-03-25 ENCOUNTER — Other Ambulatory Visit: Payer: Self-pay | Admitting: *Deleted

## 2024-03-25 DIAGNOSIS — Z5181 Encounter for therapeutic drug level monitoring: Secondary | ICD-10-CM

## 2024-03-25 DIAGNOSIS — E559 Vitamin D deficiency, unspecified: Secondary | ICD-10-CM

## 2024-04-07 DIAGNOSIS — Z5181 Encounter for therapeutic drug level monitoring: Secondary | ICD-10-CM | POA: Diagnosis not present

## 2024-04-07 DIAGNOSIS — E559 Vitamin D deficiency, unspecified: Secondary | ICD-10-CM | POA: Diagnosis not present

## 2024-04-08 ENCOUNTER — Ambulatory Visit: Payer: Self-pay | Admitting: Family Medicine

## 2024-04-08 DIAGNOSIS — R350 Frequency of micturition: Secondary | ICD-10-CM | POA: Diagnosis not present

## 2024-04-08 LAB — COMPREHENSIVE METABOLIC PANEL WITH GFR
ALT: 17 IU/L (ref 0–32)
AST: 20 IU/L (ref 0–40)
Albumin: 4.2 g/dL (ref 3.9–4.9)
Alkaline Phosphatase: 71 IU/L (ref 44–121)
BUN/Creatinine Ratio: 29 — ABNORMAL HIGH (ref 12–28)
BUN: 21 mg/dL (ref 8–27)
Bilirubin Total: 0.7 mg/dL (ref 0.0–1.2)
CO2: 23 mmol/L (ref 20–29)
Calcium: 10.4 mg/dL — ABNORMAL HIGH (ref 8.7–10.3)
Chloride: 105 mmol/L (ref 96–106)
Creatinine, Ser: 0.72 mg/dL (ref 0.57–1.00)
Globulin, Total: 2.2 g/dL (ref 1.5–4.5)
Glucose: 98 mg/dL (ref 70–99)
Potassium: 4.6 mmol/L (ref 3.5–5.2)
Sodium: 141 mmol/L (ref 134–144)
Total Protein: 6.4 g/dL (ref 6.0–8.5)
eGFR: 91 mL/min/{1.73_m2} (ref >59)

## 2024-04-08 LAB — VITAMIN D 25 HYDROXY (VIT D DEFICIENCY, FRACTURES): Vit D, 25-Hydroxy: 32.3 ng/mL (ref 30.0–100.0)

## 2024-04-14 ENCOUNTER — Ambulatory Visit

## 2024-04-21 ENCOUNTER — Ambulatory Visit

## 2024-05-04 DIAGNOSIS — R197 Diarrhea, unspecified: Secondary | ICD-10-CM | POA: Diagnosis not present

## 2024-05-06 DIAGNOSIS — R197 Diarrhea, unspecified: Secondary | ICD-10-CM | POA: Diagnosis not present

## 2024-05-12 ENCOUNTER — Encounter: Payer: Self-pay | Admitting: Family Medicine

## 2024-05-12 DIAGNOSIS — G4733 Obstructive sleep apnea (adult) (pediatric): Secondary | ICD-10-CM | POA: Insufficient documentation

## 2024-05-12 DIAGNOSIS — E559 Vitamin D deficiency, unspecified: Secondary | ICD-10-CM | POA: Insufficient documentation

## 2024-05-12 DIAGNOSIS — K580 Irritable bowel syndrome with diarrhea: Secondary | ICD-10-CM | POA: Insufficient documentation

## 2024-05-12 DIAGNOSIS — M545 Low back pain, unspecified: Secondary | ICD-10-CM | POA: Insufficient documentation

## 2024-05-12 NOTE — Patient Instructions (Incomplete)
 HEALTH MAINTENANCE RECOMMENDATIONS:  It is recommended that you get at least 30 minutes of aerobic exercise at least 5 days/week (for weight loss, you may need as much as 60-90 minutes). This can be any activity that gets your heart rate up. This can be divided in 10-15 minute intervals if needed, but try and build up your endurance at least once a week.  Weight bearing exercise is also recommended twice weekly.  Eat a healthy diet with lots of vegetables, fruits and fiber.  Colorful foods have a lot of vitamins (ie green vegetables, tomatoes, red peppers, etc).  Limit sweet tea, regular sodas and alcoholic beverages, all of which has a lot of calories and sugar.  Up to 1 alcoholic drink daily may be beneficial for women (unless trying to lose weight, watch sugars).  Drink a lot of water .  Calcium recommendations are 1200-1500 mg daily (1500 mg for postmenopausal women or women without ovaries), and vitamin D  1000 IU daily.  This should be obtained from diet and/or supplements (vitamins), and calcium should not be taken all at once, but in divided doses.  Monthly self breast exams and yearly mammograms for women over the age of 24 is recommended.  Sunscreen of at least SPF 30 should be used on all sun-exposed parts of the skin when outside between the hours of 10 am and 4 pm (not just when at beach or pool, but even with exercise, golf, tennis, and yard work!)  Use a sunscreen that says broad spectrum so it covers both UVA and UVB rays, and make sure to reapply every 1-2 hours.  Remember to change the batteries in your smoke detectors when changing your clock times in the spring and fall. Carbon monoxide detectors are recommended for your home.  Use your seat belt every time you are in a car, and please drive safely and not be distracted with cell phones and texting while driving.   Paula Hughes , Thank you for taking time to come for your Medicare Wellness Visit. I appreciate your ongoing  commitment to your health goals. Please review the following plan we discussed and let me know if I can assist you in the future.   This is a list of the screening recommended for you and due dates:  Health Maintenance  Topic Date Due   Mammogram  11/22/2023   COVID-19 Vaccine (8 - Mixed Product risk 2024-25 season) 05/28/2024*   Flu Shot  05/15/2024   Medicare Annual Wellness Visit  05/13/2025   Colon Cancer Screening  08/13/2026   DTaP/Tdap/Td vaccine (4 - Td or Tdap) 06/29/2031   Pneumococcal Vaccine for age over 55  Completed   DEXA scan (bone density measurement)  Completed   Hepatitis C Screening  Completed   Zoster (Shingles) Vaccine  Completed   Hepatitis B Vaccine  Aged Out   HPV Vaccine  Aged Out   Meningitis B Vaccine  Aged Out  *Topic was postponed. The date shown is not the original due date.    Okay to take 5000 IU of D3 once a week in the summer, but I recommend taking it 2-3 times/week in the colder months (October through end of March). Current level is on the lower end of normal, and it will likely drop in the winter if dose isn't increased.  Please remember to get mammogram in 11/2024.  Try and work on portion sizes, healthy diet and weight loss. Consider tracking again for a few months to get back on track.  Try using compression socks with travel to prevent swelling. Ensuring a low sodium diet and leg elevation will help prevent some of the swelling.

## 2024-05-12 NOTE — Progress Notes (Unsigned)
 No chief complaint on file.   Paula Hughes is a 68 y.o. female who presents for annual physical, Medicare wellness visit, and follow-up on chronic medical conditions.  She had labs done prior to her visit, see below.   She went to UC on 7/21 for 1-2 episodes of loose stools x 2 weeks. She was concerned for poss C.diff  She was treated with macrobid  for UTI the end of 03/2024 (completed 7/6), gum surgery on 7/8. Diarrhea started around that time. Stool studies were sent. Negative H pylori Ag, negative O&P.  ***no c.diff or cx results back  IBS with diarrhea: She had been having episodes of fecal incontinence a few times/year. Pelvic floor PT was recommended, but declined. Diarrhea improved when she stopped taking Mg supplement.   She is now taking inulin (chickory root powder) and bowels are fine. She hasn't needed any imodium. Bowels are well controlled on this regimen. She has fecal urgency if she is very nervous about something.  She thinks it was the Mg that caused her problems, and is fine since stopping it.   She continues to take DGL daily.  She continues to try and follow low gluten and low-dairy diet (which she read about, based on her blood type), but not always strict about it. She is s/p cholecystectomy.  She tried Colestid when having issues with diarrhea (while on Mg), but this caused a lot of gas. Bentyl caused constipation.    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 Carolanne. She had significant postoperative effusion and underwent serial aspirations as well as secondary injection to the area. This is doing much better.  Last saw Dr. Ginn in April 2025.  Saw Derm Dr. Austin 10/2023   Overactive bladder: Under the care of Dr. McDiarmid, last seen in 03/2024.  Myrbetriq is working much better than Gemtesa did.  She had h/o frequent UTI's; she has been off of trimethoprim daily for prevention since 12/2018, with no recurrent infections until she had long drives to  [facility] for 5 weeks (her husband's radiation--long car rides and didn't drink enough water ). She had 1 UTI during that time.  She had a UTI in late June. Overall this has been much better since she started vaginal estrogen suppositories 2x/week from GYN. Denies hot flashes (rare/mild).  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 Report said AHI >5 and they recommended CPAP titration study. We discussed that It shows MILD OSA.  This can be treated only if symptomatic (unrefreshed sleep, excessive daytime sleepiness, insomnia, or if has hypertension, ischemic heart disease or h/o stroke) .  Avoiding sleeping on back is recommended. Treatment can include CPAP, or she can consider an oral appliance. She reported not having any symptoms, and declined treatment. (See MyChart notes 07/2023)  Right knee pain MRI R knee in 10/2022.   IMPRESSION:  1. Medial and lateral meniscal tears.  2.  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  3.  Ganglion cyst formation along the posterior medial knee and proximal calf soft tissues.   She is got stem cell injections (from herself) on 02/23/23.  She had TKR on left, still has some chronic pain. Trying to avoid surgery.  Low back pain--flares when she is more active.  Tylenol  is no longer is effective for this pain, no longer uses it regularly.  She previously used etodolac  (long-term), stopped when she developed some GI symptoms, which resolved with use of prilosec and stopping NSAID.  She uses Etodolac   only when her back really bothers her (overdoing it in the [employer]). Last rx'd for #90 in 02/2023. (Doesn't tolerate meloxicam or celebrex, causes restless legs)  Multinodular goiter:  Previously was under the care of Dr. Faythe.  She reports that her nodules had remained stable in size x 5 years, didn't have symptoms, and didn't tolerate trial of suppressive therapy (didn't make a difference, didn't like how she felt)-- no longer sees endo.  She denies any symptoms related to the goiter, has not increased in size (if anything, seems smaller). No changes to hair/skin/nails/energy/bowels.   Hyperlipidemia. In 2019 her lipids were notably higher than on prior checks (LDL went up to 136). She reported lots of sugar and chocolate at Christmas that year.  She denies changes to diet in the last year-- *** She continues to eat 9 eggs/week, and red meat--cut back to 3 nights/week, eating more shrimp, salmon, chicken, and some pork.  Made some improvement to her diet after her husband was diagnosed with cancer. She has small amount of cheese with her eggs, and some ham. +mayonnaise and butter in her diet.  Lipids were good on last check: Lab Results  Component Value Date   CHOL 172 02/15/2023   HDL 61 02/15/2023   LDLCALC 100 (H) 02/15/2023   TRIG 58 02/15/2023   CHOLHDL 2.8 02/15/2023    Vitamin D  deficiency: When level was a little low in the past, it was recommended that she take 1000 IU of D3 during the winter months.  She has had some elevations in calcium in the past.  Last year she didn't take any vitamin D  over the winter, and level was 29. Last year she was told by ortho treating her knee that she should take 5000 IU daily.  She was advised to follow his directions, but likely can cut back to 5000 IU 2-3x/week (to use up bottle), then change to 1000 IU for more regular, longterm supplementation. Recent levels were better (low end of normal). She is currently taking ***  Component Ref Range & Units (hover) 1 mo ago 1 yr ago 2 yr ago 12 yr ago  Vit D, 25-Hydroxy 32.3 29.0 Low  CM 28.8 Low  CM 46 R, CM      Immunization History  Administered Date(s) Administered   Fluad Quad(high Dose 65+) 06/27/2023   Hepatitis A, Adult 10/17/2017, 05/08/2018   Influenza Split 07/26/2011, 07/04/2012   Influenza, High Dose Seasonal PF 06/28/2021, 06/21/2022   Influenza-Unspecified 07/16/2015, 07/09/2017, 07/17/2018, 06/18/2019, 07/05/2020    Moderna Sars-Covid-2 Vaccination 08/16/2020, 01/16/2021   PFIZER Comirnaty(Gray Top)Covid-19 Tri-Sucrose Vaccine 07/03/2022   PFIZER(Purple Top)SARS-COV-2 Vaccination 01/07/2020, 01/27/2020   PNEUMOCOCCAL CONJUGATE-20 12/14/2020   Pfizer Covid-19 Vaccine Bivalent Booster 41yrs & up 07/06/2021   Pfizer(Comirnaty)Fall Seasonal Vaccine 12 years and older 06/27/2023   Respiratory Syncytial Virus Vaccine,Recomb Aduvanted(Arexvy) 06/21/2022   Td 08/15/2004   Tdap 07/26/2011, 06/28/2021   Typhoid Live 10/18/2017   Zoster Recombinant(Shingrix) 12/26/2020, 04/03/2021   Zoster, Live 06/13/2016   Last Pap smear: s/p hysterectomy.  Last pap was 02/19/24 NILM, fungal organisms present.  No HPV performed  Last mammogram: 11/2022 Last colonoscopy:  Had at [facility] [place] 08/13/16--normal, recheck in 10 years Last DEXA: 02/2021 normal Dentist: twice yearly   Ophtho: yearly Exercise:   7 days/week elliptical (45 mins on 6 days, at least 15 mins on the 7th); Weights on MWF (using elastic bands and hand weights).  T/Th/Sat core exercises.   Patient Care Team: Randol Dawes,  MD as PCP - General (Family Medicine) MacDiarmid, Glendia, MD as Consulting Physician (Urology) Lyda Netter, MD as Referring Physician (Gastroenterology) Delene Rush, MD as Referring Physician (Orthopedic Surgery) GYN: Dr. Sharman Counts [facility] in [place]) Dentist: Dr. Jannett Ophtho: Dr. Anette Domino: Dr. Austin GI: Dr. Caprice Essex Ortho: Dr. Debby Barbara (hand) Ortho/sports med: Dr. [place] (hip) Dr. Gorman cell injections to knee    Depression Screening: Flowsheet Row Office Visit from 02/21/2023 in [facility]  PHQ-2 Total Score 0     Falls screen:     02/21/2023    3:07 PM 01/11/2022    1:47 PM 12/14/2020    2:14 PM 11/07/2016    1:47 PM 06/09/2015    2:51 PM  Fall Risk   Falls in the past year? 0 0 0 No  No   Number falls in past yr: 0 0     Injury  with Fall? 0 0     Risk for fall due to : No Fall Risks No Fall Risks     Follow up Falls evaluation completed Falls evaluation completed         Data saved with a previous flowsheet row definition     Functional Status Survey:        End of Life Discussion:  Patient has a living will and medical power of attorney, in chart   PMH, PSH, SH and FH were reviewed and updated      ROS: The patient denies anorexia, fever, headaches, vision changes, decreased hearing, ear pain, sore throat, dizziness, syncope, dyspnea on exertion, cough, swelling, nausea, vomiting, diarrhea, constipation, abdominal pain, melena, hematochezia, hematuria, incontinence (controlled with meds), dysuria, vaginal bleeding, discharge, odor or itch, genital lesions, numbness, tingling, weakness, tremor, suspicious skin lesions, depression, anxiety, abnormal bleeding/bruising, or enlarged lymph nodes.  Mild/rare hot flashes Chronic low back pain, no longer takes tylenol , rarely needs NSAID. Allergies controlled with allegra. Some knee stiffness in the mornings, mild pain in L knee (that was replaced) R knee pain ***  Up 2-3x/night to void, unchanged. Denies dysuria, incontinence See HPI    PHYSICAL EXAM:  There were no vitals taken for this visit.  Wt Readings from Last 3 Encounters:  06/10/23 203 lb 6.4 oz (92.3 kg)  02/21/23 196 lb 9.6 oz (89.2 kg)  01/11/22 193 lb 6.4 oz (87.7 kg)    General Appearance:   Alert, cooperative, no distress, appears stated age    Head:   Normocephalic, without obvious abnormality, atraumatic    Eyes:   PERRL, conjunctiva/corneas clear, EOM's intact, fundi benign    Ears:   Normal TM's and external ear canals    Nose:   Normal, no drainage or sinus tenderness  Throat:   No erythema or lesions  Neck:   Supple, no lymphadenopathy; thyroid : multinodular goiter noted, more prominent on right, unchanged; no carotid bruit or JVD    Back:   Spine nontender, no curvature, ROM  normal, no CVA tenderness. WHSS midline    Lungs:   Clear to auscultation bilaterally without wheezes, rales or ronchi; respirations unlabored    Chest Wall:   No tenderness or deformity    Heart:   Regular rate and rhythm, S1 and S2 normal, no murmur, rub or gallop    Breast Exam:   Deferred to GYN    Abdomen:   Soft, non-tender, nondistended, normoactive bowel sounds, no masses, no hepatosplenomegaly    Genitalia:   deferred to GYN    Rectal:  Deferred to GYN    Extremities:   No clubbing, cyanosis or edema. Hammertoe at B 2nd toes, starting at 3rd on the right.   Pulses:   2+ and symmetric all extremities    Skin:   Skin color, texture, turgor are normal. No suspicious lesions.  Lymph nodes:   Cervical, supraclavicular, inguinal nodes normal    Neurologic:   Normal strength, sensation and gait; reflexes 2+ and symmetric throughout                   Psych:   Normal mood, affect, hygiene and grooming   ***UPDATE feet--hammertoes 2nd bilat, 3rd on right?   Recent labs:  Vitamin D -OH 32.3 Fasting glucose 98   Chemistry      Component Value Date/Time   NA 141 04/07/2024 0910   K 4.6 04/07/2024 0910   CL 105 04/07/2024 0910   CO2 23 04/07/2024 0910   BUN 21 04/07/2024 0910   CREATININE 0.72 04/07/2024 0910   CREATININE 0.76 10/17/2017 0938      Component Value Date/Time   CALCIUM 10.4 (H) 04/07/2024 0910   ALKPHOS 71 04/07/2024 0910   AST 20 04/07/2024 0910   ALT 17 04/07/2024 0910   BILITOT 0.7 04/07/2024 0910       ASSESSMENT/PLAN:  Abstract pap from 02/19/24. NILM, fungal organisms present.  No HPV performed (q3 yr paps) Any mammo since 11/2022?   Discussed monthly self breast exams and yearly mammograms (pt prefers every other year); at least 30 minutes of aerobic activity at least 5 days/week, weight-bearing exercise 2x/week; proper sunscreen use reviewed; healthy diet, including goals of calcium and vitamin D  intake and alcohol recommendations (less than or equal to  1 drink/day) reviewed; regular seatbelt use; changing batteries in smoke detectors.  Immunization recommendations discussed--continue yearly flu shots (high dose).  Recommend COVID booster when updated in the Fall. Colonoscopy recommendations reviewed--UTD.  MOST form completed.  Full Code, Full Care.  F/u 1 year, sooner prn.   Medicare Attestation I have personally reviewed: The patient's medical and social history Their use of alcohol, tobacco or illicit drugs Their current medications and supplements The patient's functional ability including ADLs,fall risks, home safety risks, cognitive, and hearing and visual impairment Diet and physical activities Evidence for depression or mood disorders  The patient's weight, height, BMI have been recorded in the chart.  I have made referrals, counseling, and provided education to the patient based on review of the above and I have provided the patient with a written personalized care plan for preventive services.     Annabelle DELENA Fetters, MD

## 2024-05-13 ENCOUNTER — Encounter: Payer: Self-pay | Admitting: Family Medicine

## 2024-05-13 ENCOUNTER — Ambulatory Visit: Admitting: Family Medicine

## 2024-05-13 VITALS — BP 124/70 | HR 72 | Ht 69.0 in | Wt 206.0 lb

## 2024-05-13 DIAGNOSIS — K580 Irritable bowel syndrome with diarrhea: Secondary | ICD-10-CM | POA: Diagnosis not present

## 2024-05-13 DIAGNOSIS — Z0000 Encounter for general adult medical examination without abnormal findings: Secondary | ICD-10-CM

## 2024-05-13 DIAGNOSIS — G8929 Other chronic pain: Secondary | ICD-10-CM

## 2024-05-13 DIAGNOSIS — E7800 Pure hypercholesterolemia, unspecified: Secondary | ICD-10-CM | POA: Diagnosis not present

## 2024-05-13 DIAGNOSIS — R600 Localized edema: Secondary | ICD-10-CM

## 2024-05-13 DIAGNOSIS — E559 Vitamin D deficiency, unspecified: Secondary | ICD-10-CM

## 2024-05-13 DIAGNOSIS — E042 Nontoxic multinodular goiter: Secondary | ICD-10-CM | POA: Diagnosis not present

## 2024-05-13 DIAGNOSIS — M1711 Unilateral primary osteoarthritis, right knee: Secondary | ICD-10-CM

## 2024-05-13 DIAGNOSIS — N3281 Overactive bladder: Secondary | ICD-10-CM

## 2024-05-13 DIAGNOSIS — M545 Low back pain, unspecified: Secondary | ICD-10-CM

## 2024-05-13 DIAGNOSIS — Z5181 Encounter for therapeutic drug level monitoring: Secondary | ICD-10-CM | POA: Diagnosis not present

## 2024-05-13 DIAGNOSIS — G4733 Obstructive sleep apnea (adult) (pediatric): Secondary | ICD-10-CM

## 2024-05-14 ENCOUNTER — Encounter: Payer: Self-pay | Admitting: *Deleted

## 2024-05-14 ENCOUNTER — Encounter: Payer: Self-pay | Admitting: Family Medicine

## 2024-05-14 NOTE — Progress Notes (Signed)
 Done

## 2024-06-24 ENCOUNTER — Encounter: Payer: Self-pay | Admitting: Family Medicine

## 2024-06-24 ENCOUNTER — Other Ambulatory Visit: Payer: Self-pay | Admitting: *Deleted

## 2024-06-24 DIAGNOSIS — Z23 Encounter for immunization: Secondary | ICD-10-CM

## 2024-06-24 MED ORDER — COVID-19 MRNA VAC-TRIS(PFIZER) 30 MCG/0.3ML IM SUSY: 0.3000 mL | PREFILLED_SYRINGE | Freq: Once | INTRAMUSCULAR | 0 refills | Status: AC

## 2024-06-24 MED ORDER — MNEXSPIKE 10 MCG/0.2ML IM SUSY: 0.2000 mL | PREFILLED_SYRINGE | Freq: Once | INTRAMUSCULAR | 0 refills | Status: AC

## 2024-07-03 DIAGNOSIS — M7061 Trochanteric bursitis, right hip: Secondary | ICD-10-CM | POA: Diagnosis not present

## 2024-07-03 DIAGNOSIS — M7062 Trochanteric bursitis, left hip: Secondary | ICD-10-CM | POA: Diagnosis not present

## 2024-07-03 DIAGNOSIS — M25552 Pain in left hip: Secondary | ICD-10-CM | POA: Diagnosis not present

## 2024-07-03 DIAGNOSIS — G8929 Other chronic pain: Secondary | ICD-10-CM | POA: Diagnosis not present

## 2024-07-03 DIAGNOSIS — M25551 Pain in right hip: Secondary | ICD-10-CM | POA: Diagnosis not present

## 2024-10-19 ENCOUNTER — Encounter: Payer: Self-pay | Admitting: Family Medicine

## 2024-10-26 ENCOUNTER — Telehealth: Payer: Self-pay | Admitting: Family Medicine

## 2024-10-26 MED ORDER — MYRBETRIQ 50 MG PO TB24: 50.0000 mg | ORAL_TABLET | Freq: Every day | ORAL | 1 refills | Status: AC

## 2024-10-26 NOTE — Telephone Encounter (Signed)
 Per Dr. Randol visit in 04/2024 ok to prescribe in the future   Sent in refills

## 2024-10-26 NOTE — Telephone Encounter (Signed)
 [facility] Home delivery fax  Myrbetriq  50 mg

## 2024-10-29 ENCOUNTER — Encounter: Payer: Self-pay | Admitting: Family Medicine

## 2024-10-29 ENCOUNTER — Ambulatory Visit: Payer: Medicare (Managed Care) | Admitting: Family Medicine

## 2024-10-29 VITALS — BP 120/70 | HR 80 | Temp 97.2°F | Ht 70.5 in | Wt 206.8 lb

## 2024-10-29 DIAGNOSIS — R350 Frequency of micturition: Secondary | ICD-10-CM

## 2024-10-29 DIAGNOSIS — R300 Dysuria: Secondary | ICD-10-CM

## 2024-10-29 LAB — POCT URINALYSIS DIP (PROADVANTAGE DEVICE)
Blood, UA: NEGATIVE
Glucose, UA: NEGATIVE mg/dL
Ketones, POC UA: NEGATIVE mg/dL
Nitrite, UA: POSITIVE — AB
Protein Ur, POC: NEGATIVE mg/dL
Specific Gravity, Urine: 1.005
Urobilinogen, Ur: 0.2
pH, UA: 7

## 2024-10-29 MED ORDER — NITROFURANTOIN MONOHYD MACRO 100 MG PO CAPS: 100.0000 mg | ORAL_CAPSULE | Freq: Two times a day (BID) | ORAL | 0 refills | Status: AC

## 2024-10-29 NOTE — Progress Notes (Signed)
" ° °  Name: Paula Hughes   Date of Visit: 10/29/24   Date of last visit with me: Visit date not found   CHIEF COMPLAINT:  Chief Complaint  Patient presents with   other    Possible uti-burning, urinary frequency started this morning-        HPI:  Discussed the use of AI scribe software for clinical note transcription with the patient, who gave verbal consent to proceed.  History of Present Illness   Paula Hughes is a 69 year old female with a history of urinary tract infections who presents with symptoms of a UTI.  She notes that her UTIs are typically caused by E. coli.  She cannot take sulfa medications due to an allergy. In the past, she has been treated with Macrobid , which she tolerates well. She uses estrogen suppositories to help prevent UTIs and mentions that it has been a while since her last infection.  She usually seeks treatment at an urgent care facility in [place] but is currently visiting due to a dentist appointment in [facility] and a lunch meeting with friends in [place].         OBJECTIVE:       05/13/2024    2:08 PM  Depression screen PHQ 2/9  Decreased Interest 0  Down, Depressed, Hopeless 0  PHQ - 2 Score 0     BP Readings from Last 3 Encounters:  10/29/24 120/70  05/13/24 124/70  06/10/23 118/70    BP 120/70   Pulse 80   Temp (!) 97.2 F (36.2 C) (Tympanic)   Ht 5' 10.5 (1.791 m)   Wt 206 lb 12.8 oz (93.8 kg)   SpO2 96%   BMI 29.25 kg/m    Physical Exam          Physical Exam Constitutional:      Appearance: Normal appearance.  Neurological:     General: No focal deficit present.     Mental Status: She is alert and oriented to person, place, and time. Mental status is at baseline.     ASSESSMENT/PLAN:   Assessment & Plan Urination frequency  Burning with urination    Assessment and Plan    Acute urinary tract infection Positive nitrite test suggests infection, likely E. coli. Sulfa allergy noted. Estrogen  suppositories effective for prevention. - Prescribed Macrobid . - Ordered urine culture for sensitivity. - Advised to contact if no improvement by next week for possible antibiotic change.         Demeshia Sherburne A. Vita MD [facility] "

## 2024-10-30 LAB — URINE CULTURE

## 2025-05-19 ENCOUNTER — Ambulatory Visit: Payer: Self-pay | Admitting: Family Medicine
